# Patient Record
Sex: Female | Born: 1935
Health system: Southern US, Community
[De-identification: ages and names within clinical notes are randomized; demographics above are authoritative.]

## PROBLEM LIST (undated history)

## (undated) DIAGNOSIS — K5792 Diverticulitis of intestine, part unspecified, without perforation or abscess without bleeding: Secondary | ICD-10-CM

## (undated) DIAGNOSIS — M1990 Unspecified osteoarthritis, unspecified site: Secondary | ICD-10-CM

## (undated) HISTORY — PX: JOINT REPLACEMENT: SHX530

---

## 2016-11-22 DIAGNOSIS — L570 Actinic keratosis: Secondary | ICD-10-CM | POA: Diagnosis not present

## 2016-12-21 DIAGNOSIS — F419 Anxiety disorder, unspecified: Secondary | ICD-10-CM | POA: Diagnosis present

## 2016-12-21 DIAGNOSIS — I10 Essential (primary) hypertension: Secondary | ICD-10-CM | POA: Diagnosis not present

## 2016-12-21 DIAGNOSIS — G4700 Insomnia, unspecified: Secondary | ICD-10-CM | POA: Diagnosis present

## 2016-12-21 DIAGNOSIS — E039 Hypothyroidism, unspecified: Secondary | ICD-10-CM | POA: Diagnosis present

## 2016-12-21 DIAGNOSIS — K5730 Diverticulosis of large intestine without perforation or abscess without bleeding: Secondary | ICD-10-CM | POA: Diagnosis not present

## 2016-12-21 DIAGNOSIS — M19012 Primary osteoarthritis, left shoulder: Secondary | ICD-10-CM | POA: Diagnosis not present

## 2016-12-21 DIAGNOSIS — Z471 Aftercare following joint replacement surgery: Secondary | ICD-10-CM | POA: Diagnosis not present

## 2016-12-21 DIAGNOSIS — M8580 Other specified disorders of bone density and structure, unspecified site: Secondary | ICD-10-CM | POA: Diagnosis present

## 2016-12-21 DIAGNOSIS — I129 Hypertensive chronic kidney disease with stage 1 through stage 4 chronic kidney disease, or unspecified chronic kidney disease: Secondary | ICD-10-CM | POA: Diagnosis present

## 2016-12-21 DIAGNOSIS — S72002A Fracture of unspecified part of neck of left femur, initial encounter for closed fracture: Secondary | ICD-10-CM | POA: Diagnosis not present

## 2016-12-21 DIAGNOSIS — E890 Postprocedural hypothyroidism: Secondary | ICD-10-CM | POA: Diagnosis not present

## 2016-12-21 DIAGNOSIS — F418 Other specified anxiety disorders: Secondary | ICD-10-CM | POA: Diagnosis not present

## 2016-12-21 DIAGNOSIS — G8918 Other acute postprocedural pain: Secondary | ICD-10-CM | POA: Diagnosis not present

## 2016-12-21 DIAGNOSIS — F329 Major depressive disorder, single episode, unspecified: Secondary | ICD-10-CM | POA: Diagnosis not present

## 2016-12-21 DIAGNOSIS — I252 Old myocardial infarction: Secondary | ICD-10-CM | POA: Diagnosis not present

## 2016-12-21 DIAGNOSIS — E785 Hyperlipidemia, unspecified: Secondary | ICD-10-CM | POA: Diagnosis present

## 2016-12-21 DIAGNOSIS — S72002D Fracture of unspecified part of neck of left femur, subsequent encounter for closed fracture with routine healing: Secondary | ICD-10-CM | POA: Diagnosis not present

## 2016-12-21 DIAGNOSIS — I6529 Occlusion and stenosis of unspecified carotid artery: Secondary | ICD-10-CM | POA: Diagnosis not present

## 2016-12-21 DIAGNOSIS — E782 Mixed hyperlipidemia: Secondary | ICD-10-CM | POA: Diagnosis not present

## 2016-12-21 DIAGNOSIS — Z7982 Long term (current) use of aspirin: Secondary | ICD-10-CM | POA: Diagnosis not present

## 2016-12-21 DIAGNOSIS — R262 Difficulty in walking, not elsewhere classified: Secondary | ICD-10-CM | POA: Diagnosis not present

## 2016-12-21 DIAGNOSIS — H8149 Vertigo of central origin, unspecified ear: Secondary | ICD-10-CM | POA: Diagnosis not present

## 2016-12-21 DIAGNOSIS — I959 Hypotension, unspecified: Secondary | ICD-10-CM | POA: Diagnosis not present

## 2016-12-21 DIAGNOSIS — F4323 Adjustment disorder with mixed anxiety and depressed mood: Secondary | ICD-10-CM | POA: Diagnosis not present

## 2016-12-21 DIAGNOSIS — K219 Gastro-esophageal reflux disease without esophagitis: Secondary | ICD-10-CM | POA: Diagnosis present

## 2016-12-21 DIAGNOSIS — M6281 Muscle weakness (generalized): Secondary | ICD-10-CM | POA: Diagnosis not present

## 2016-12-21 DIAGNOSIS — Z01818 Encounter for other preprocedural examination: Secondary | ICD-10-CM | POA: Diagnosis not present

## 2016-12-21 DIAGNOSIS — N179 Acute kidney failure, unspecified: Secondary | ICD-10-CM | POA: Diagnosis not present

## 2016-12-21 DIAGNOSIS — Z96642 Presence of left artificial hip joint: Secondary | ICD-10-CM | POA: Diagnosis not present

## 2016-12-21 DIAGNOSIS — H3530 Unspecified macular degeneration: Secondary | ICD-10-CM | POA: Diagnosis not present

## 2016-12-21 DIAGNOSIS — N183 Chronic kidney disease, stage 3 (moderate): Secondary | ICD-10-CM | POA: Diagnosis present

## 2016-12-25 DIAGNOSIS — F418 Other specified anxiety disorders: Secondary | ICD-10-CM | POA: Diagnosis not present

## 2016-12-25 DIAGNOSIS — L03116 Cellulitis of left lower limb: Secondary | ICD-10-CM | POA: Diagnosis not present

## 2016-12-25 DIAGNOSIS — G4700 Insomnia, unspecified: Secondary | ICD-10-CM | POA: Diagnosis not present

## 2016-12-25 DIAGNOSIS — E890 Postprocedural hypothyroidism: Secondary | ICD-10-CM | POA: Diagnosis not present

## 2016-12-25 DIAGNOSIS — H3530 Unspecified macular degeneration: Secondary | ICD-10-CM | POA: Diagnosis not present

## 2016-12-25 DIAGNOSIS — M8580 Other specified disorders of bone density and structure, unspecified site: Secondary | ICD-10-CM | POA: Diagnosis not present

## 2016-12-25 DIAGNOSIS — S72002D Fracture of unspecified part of neck of left femur, subsequent encounter for closed fracture with routine healing: Secondary | ICD-10-CM | POA: Diagnosis not present

## 2016-12-25 DIAGNOSIS — R262 Difficulty in walking, not elsewhere classified: Secondary | ICD-10-CM | POA: Diagnosis not present

## 2016-12-25 DIAGNOSIS — M6281 Muscle weakness (generalized): Secondary | ICD-10-CM | POA: Diagnosis not present

## 2016-12-25 DIAGNOSIS — K219 Gastro-esophageal reflux disease without esophagitis: Secondary | ICD-10-CM | POA: Diagnosis not present

## 2016-12-25 DIAGNOSIS — I6529 Occlusion and stenosis of unspecified carotid artery: Secondary | ICD-10-CM | POA: Diagnosis not present

## 2016-12-25 DIAGNOSIS — K5730 Diverticulosis of large intestine without perforation or abscess without bleeding: Secondary | ICD-10-CM | POA: Diagnosis not present

## 2016-12-25 DIAGNOSIS — F4323 Adjustment disorder with mixed anxiety and depressed mood: Secondary | ICD-10-CM | POA: Diagnosis not present

## 2016-12-25 DIAGNOSIS — M25562 Pain in left knee: Secondary | ICD-10-CM | POA: Diagnosis not present

## 2016-12-25 DIAGNOSIS — S72002A Fracture of unspecified part of neck of left femur, initial encounter for closed fracture: Secondary | ICD-10-CM | POA: Diagnosis not present

## 2016-12-25 DIAGNOSIS — H8149 Vertigo of central origin, unspecified ear: Secondary | ICD-10-CM | POA: Diagnosis not present

## 2016-12-25 DIAGNOSIS — E785 Hyperlipidemia, unspecified: Secondary | ICD-10-CM | POA: Diagnosis not present

## 2016-12-25 DIAGNOSIS — R2242 Localized swelling, mass and lump, left lower limb: Secondary | ICD-10-CM | POA: Diagnosis not present

## 2016-12-25 DIAGNOSIS — I10 Essential (primary) hypertension: Secondary | ICD-10-CM | POA: Diagnosis not present

## 2016-12-25 DIAGNOSIS — M19012 Primary osteoarthritis, left shoulder: Secondary | ICD-10-CM | POA: Diagnosis not present

## 2016-12-25 DIAGNOSIS — Z96642 Presence of left artificial hip joint: Secondary | ICD-10-CM | POA: Diagnosis not present

## 2016-12-25 DIAGNOSIS — N183 Chronic kidney disease, stage 3 (moderate): Secondary | ICD-10-CM | POA: Diagnosis not present

## 2016-12-28 DIAGNOSIS — I10 Essential (primary) hypertension: Secondary | ICD-10-CM | POA: Diagnosis not present

## 2016-12-28 DIAGNOSIS — S72002D Fracture of unspecified part of neck of left femur, subsequent encounter for closed fracture with routine healing: Secondary | ICD-10-CM | POA: Diagnosis not present

## 2016-12-28 DIAGNOSIS — N183 Chronic kidney disease, stage 3 (moderate): Secondary | ICD-10-CM | POA: Diagnosis not present

## 2016-12-28 DIAGNOSIS — M6281 Muscle weakness (generalized): Secondary | ICD-10-CM | POA: Diagnosis not present

## 2017-01-06 DIAGNOSIS — L03116 Cellulitis of left lower limb: Secondary | ICD-10-CM | POA: Diagnosis not present

## 2017-01-06 DIAGNOSIS — M6281 Muscle weakness (generalized): Secondary | ICD-10-CM | POA: Diagnosis not present

## 2017-01-06 DIAGNOSIS — N183 Chronic kidney disease, stage 3 (moderate): Secondary | ICD-10-CM | POA: Diagnosis not present

## 2017-01-06 DIAGNOSIS — S72002D Fracture of unspecified part of neck of left femur, subsequent encounter for closed fracture with routine healing: Secondary | ICD-10-CM | POA: Diagnosis not present

## 2017-01-14 DIAGNOSIS — M6281 Muscle weakness (generalized): Secondary | ICD-10-CM | POA: Diagnosis not present

## 2017-01-14 DIAGNOSIS — L03116 Cellulitis of left lower limb: Secondary | ICD-10-CM | POA: Diagnosis not present

## 2017-01-14 DIAGNOSIS — N183 Chronic kidney disease, stage 3 (moderate): Secondary | ICD-10-CM | POA: Diagnosis not present

## 2017-01-14 DIAGNOSIS — S72002D Fracture of unspecified part of neck of left femur, subsequent encounter for closed fracture with routine healing: Secondary | ICD-10-CM | POA: Diagnosis not present

## 2017-01-16 DIAGNOSIS — S72002D Fracture of unspecified part of neck of left femur, subsequent encounter for closed fracture with routine healing: Secondary | ICD-10-CM | POA: Diagnosis not present

## 2017-01-16 DIAGNOSIS — M25562 Pain in left knee: Secondary | ICD-10-CM | POA: Diagnosis not present

## 2017-01-19 DIAGNOSIS — Z96642 Presence of left artificial hip joint: Secondary | ICD-10-CM | POA: Diagnosis not present

## 2017-01-19 DIAGNOSIS — S72002D Fracture of unspecified part of neck of left femur, subsequent encounter for closed fracture with routine healing: Secondary | ICD-10-CM | POA: Diagnosis not present

## 2017-01-19 DIAGNOSIS — R600 Localized edema: Secondary | ICD-10-CM | POA: Diagnosis not present

## 2017-01-23 DIAGNOSIS — R600 Localized edema: Secondary | ICD-10-CM | POA: Diagnosis not present

## 2017-01-23 DIAGNOSIS — Z96642 Presence of left artificial hip joint: Secondary | ICD-10-CM | POA: Diagnosis not present

## 2017-01-23 DIAGNOSIS — S72002D Fracture of unspecified part of neck of left femur, subsequent encounter for closed fracture with routine healing: Secondary | ICD-10-CM | POA: Diagnosis not present

## 2017-01-26 DIAGNOSIS — R600 Localized edema: Secondary | ICD-10-CM | POA: Diagnosis not present

## 2017-01-26 DIAGNOSIS — S72002D Fracture of unspecified part of neck of left femur, subsequent encounter for closed fracture with routine healing: Secondary | ICD-10-CM | POA: Diagnosis not present

## 2017-01-26 DIAGNOSIS — Z96642 Presence of left artificial hip joint: Secondary | ICD-10-CM | POA: Diagnosis not present

## 2017-01-30 DIAGNOSIS — R600 Localized edema: Secondary | ICD-10-CM | POA: Diagnosis not present

## 2017-01-30 DIAGNOSIS — Z96642 Presence of left artificial hip joint: Secondary | ICD-10-CM | POA: Diagnosis not present

## 2017-01-30 DIAGNOSIS — S72002D Fracture of unspecified part of neck of left femur, subsequent encounter for closed fracture with routine healing: Secondary | ICD-10-CM | POA: Diagnosis not present

## 2017-02-02 DIAGNOSIS — Z96642 Presence of left artificial hip joint: Secondary | ICD-10-CM | POA: Diagnosis not present

## 2017-02-02 DIAGNOSIS — R600 Localized edema: Secondary | ICD-10-CM | POA: Diagnosis not present

## 2017-02-02 DIAGNOSIS — S72002D Fracture of unspecified part of neck of left femur, subsequent encounter for closed fracture with routine healing: Secondary | ICD-10-CM | POA: Diagnosis not present

## 2017-02-06 DIAGNOSIS — S72002D Fracture of unspecified part of neck of left femur, subsequent encounter for closed fracture with routine healing: Secondary | ICD-10-CM | POA: Diagnosis not present

## 2017-02-06 DIAGNOSIS — Z96642 Presence of left artificial hip joint: Secondary | ICD-10-CM | POA: Diagnosis not present

## 2017-02-06 DIAGNOSIS — R600 Localized edema: Secondary | ICD-10-CM | POA: Diagnosis not present

## 2017-02-09 DIAGNOSIS — S72002D Fracture of unspecified part of neck of left femur, subsequent encounter for closed fracture with routine healing: Secondary | ICD-10-CM | POA: Diagnosis not present

## 2017-02-09 DIAGNOSIS — Z96642 Presence of left artificial hip joint: Secondary | ICD-10-CM | POA: Diagnosis not present

## 2017-02-09 DIAGNOSIS — R600 Localized edema: Secondary | ICD-10-CM | POA: Diagnosis not present

## 2017-02-13 DIAGNOSIS — R600 Localized edema: Secondary | ICD-10-CM | POA: Diagnosis not present

## 2017-02-13 DIAGNOSIS — Z96642 Presence of left artificial hip joint: Secondary | ICD-10-CM | POA: Diagnosis not present

## 2017-02-13 DIAGNOSIS — S72002D Fracture of unspecified part of neck of left femur, subsequent encounter for closed fracture with routine healing: Secondary | ICD-10-CM | POA: Diagnosis not present

## 2017-02-16 DIAGNOSIS — R600 Localized edema: Secondary | ICD-10-CM | POA: Diagnosis not present

## 2017-02-16 DIAGNOSIS — Z96642 Presence of left artificial hip joint: Secondary | ICD-10-CM | POA: Diagnosis not present

## 2017-02-16 DIAGNOSIS — S72002D Fracture of unspecified part of neck of left femur, subsequent encounter for closed fracture with routine healing: Secondary | ICD-10-CM | POA: Diagnosis not present

## 2017-02-23 DIAGNOSIS — S72002D Fracture of unspecified part of neck of left femur, subsequent encounter for closed fracture with routine healing: Secondary | ICD-10-CM | POA: Diagnosis not present

## 2017-02-23 DIAGNOSIS — Z96642 Presence of left artificial hip joint: Secondary | ICD-10-CM | POA: Diagnosis not present

## 2017-02-23 DIAGNOSIS — R600 Localized edema: Secondary | ICD-10-CM | POA: Diagnosis not present

## 2017-03-01 DIAGNOSIS — L570 Actinic keratosis: Secondary | ICD-10-CM | POA: Diagnosis not present

## 2017-04-16 DIAGNOSIS — E890 Postprocedural hypothyroidism: Secondary | ICD-10-CM | POA: Diagnosis not present

## 2017-04-16 DIAGNOSIS — I10 Essential (primary) hypertension: Secondary | ICD-10-CM | POA: Diagnosis not present

## 2017-04-16 DIAGNOSIS — Z96642 Presence of left artificial hip joint: Secondary | ICD-10-CM | POA: Diagnosis not present

## 2017-04-16 DIAGNOSIS — Z1329 Encounter for screening for other suspected endocrine disorder: Secondary | ICD-10-CM | POA: Diagnosis not present

## 2017-04-16 DIAGNOSIS — H3530 Unspecified macular degeneration: Secondary | ICD-10-CM | POA: Diagnosis not present

## 2017-04-16 DIAGNOSIS — Z85828 Personal history of other malignant neoplasm of skin: Secondary | ICD-10-CM | POA: Diagnosis not present

## 2017-04-16 DIAGNOSIS — R5382 Chronic fatigue, unspecified: Secondary | ICD-10-CM | POA: Diagnosis not present

## 2017-04-16 DIAGNOSIS — Z136 Encounter for screening for cardiovascular disorders: Secondary | ICD-10-CM | POA: Diagnosis not present

## 2017-04-16 DIAGNOSIS — Z139 Encounter for screening, unspecified: Secondary | ICD-10-CM | POA: Diagnosis not present

## 2017-04-16 DIAGNOSIS — N182 Chronic kidney disease, stage 2 (mild): Secondary | ICD-10-CM | POA: Diagnosis not present

## 2017-06-06 DIAGNOSIS — L570 Actinic keratosis: Secondary | ICD-10-CM | POA: Diagnosis not present

## 2017-07-27 DIAGNOSIS — K219 Gastro-esophageal reflux disease without esophagitis: Secondary | ICD-10-CM | POA: Diagnosis not present

## 2017-07-27 DIAGNOSIS — G4700 Insomnia, unspecified: Secondary | ICD-10-CM | POA: Diagnosis not present

## 2017-07-27 DIAGNOSIS — K5732 Diverticulitis of large intestine without perforation or abscess without bleeding: Secondary | ICD-10-CM | POA: Diagnosis not present

## 2017-07-27 DIAGNOSIS — M1990 Unspecified osteoarthritis, unspecified site: Secondary | ICD-10-CM | POA: Diagnosis not present

## 2017-07-27 DIAGNOSIS — N183 Chronic kidney disease, stage 3 (moderate): Secondary | ICD-10-CM | POA: Diagnosis not present

## 2017-07-27 DIAGNOSIS — I129 Hypertensive chronic kidney disease with stage 1 through stage 4 chronic kidney disease, or unspecified chronic kidney disease: Secondary | ICD-10-CM | POA: Diagnosis not present

## 2017-07-27 DIAGNOSIS — E039 Hypothyroidism, unspecified: Secondary | ICD-10-CM | POA: Diagnosis not present

## 2017-07-27 DIAGNOSIS — Z79899 Other long term (current) drug therapy: Secondary | ICD-10-CM | POA: Diagnosis not present

## 2017-07-27 DIAGNOSIS — F329 Major depressive disorder, single episode, unspecified: Secondary | ICD-10-CM | POA: Diagnosis not present

## 2017-07-27 DIAGNOSIS — F419 Anxiety disorder, unspecified: Secondary | ICD-10-CM | POA: Diagnosis not present

## 2017-07-27 DIAGNOSIS — Z7982 Long term (current) use of aspirin: Secondary | ICD-10-CM | POA: Diagnosis not present

## 2017-07-27 DIAGNOSIS — E785 Hyperlipidemia, unspecified: Secondary | ICD-10-CM | POA: Diagnosis not present

## 2017-08-02 DIAGNOSIS — K5792 Diverticulitis of intestine, part unspecified, without perforation or abscess without bleeding: Secondary | ICD-10-CM | POA: Diagnosis not present

## 2017-08-02 DIAGNOSIS — R202 Paresthesia of skin: Secondary | ICD-10-CM | POA: Diagnosis not present

## 2017-08-17 DIAGNOSIS — Z23 Encounter for immunization: Secondary | ICD-10-CM | POA: Diagnosis not present

## 2018-03-18 DIAGNOSIS — Z79899 Other long term (current) drug therapy: Secondary | ICD-10-CM | POA: Diagnosis not present

## 2018-03-18 DIAGNOSIS — E7800 Pure hypercholesterolemia, unspecified: Secondary | ICD-10-CM | POA: Diagnosis not present

## 2018-03-18 DIAGNOSIS — I10 Essential (primary) hypertension: Secondary | ICD-10-CM | POA: Diagnosis not present

## 2018-03-18 DIAGNOSIS — F419 Anxiety disorder, unspecified: Secondary | ICD-10-CM | POA: Diagnosis not present

## 2018-03-18 DIAGNOSIS — E039 Hypothyroidism, unspecified: Secondary | ICD-10-CM | POA: Diagnosis not present

## 2018-03-18 DIAGNOSIS — H3530 Unspecified macular degeneration: Secondary | ICD-10-CM | POA: Diagnosis not present

## 2018-03-18 DIAGNOSIS — J301 Allergic rhinitis due to pollen: Secondary | ICD-10-CM | POA: Diagnosis not present

## 2018-03-18 DIAGNOSIS — K219 Gastro-esophageal reflux disease without esophagitis: Secondary | ICD-10-CM | POA: Diagnosis not present

## 2018-07-23 DIAGNOSIS — N393 Stress incontinence (female) (male): Secondary | ICD-10-CM | POA: Diagnosis not present

## 2018-07-23 DIAGNOSIS — E039 Hypothyroidism, unspecified: Secondary | ICD-10-CM | POA: Diagnosis not present

## 2018-07-23 DIAGNOSIS — F419 Anxiety disorder, unspecified: Secondary | ICD-10-CM | POA: Diagnosis not present

## 2018-07-23 DIAGNOSIS — Z0001 Encounter for general adult medical examination with abnormal findings: Secondary | ICD-10-CM | POA: Diagnosis not present

## 2018-07-23 DIAGNOSIS — Z79899 Other long term (current) drug therapy: Secondary | ICD-10-CM | POA: Diagnosis not present

## 2018-07-23 DIAGNOSIS — R300 Dysuria: Secondary | ICD-10-CM | POA: Diagnosis not present

## 2018-07-23 DIAGNOSIS — H3530 Unspecified macular degeneration: Secondary | ICD-10-CM | POA: Diagnosis not present

## 2018-07-23 DIAGNOSIS — E7800 Pure hypercholesterolemia, unspecified: Secondary | ICD-10-CM | POA: Diagnosis not present

## 2018-07-23 DIAGNOSIS — K219 Gastro-esophageal reflux disease without esophagitis: Secondary | ICD-10-CM | POA: Diagnosis not present

## 2018-07-23 DIAGNOSIS — N949 Unspecified condition associated with female genital organs and menstrual cycle: Secondary | ICD-10-CM | POA: Diagnosis not present

## 2018-07-23 DIAGNOSIS — J301 Allergic rhinitis due to pollen: Secondary | ICD-10-CM | POA: Diagnosis not present

## 2018-07-23 DIAGNOSIS — I10 Essential (primary) hypertension: Secondary | ICD-10-CM | POA: Diagnosis not present

## 2018-07-29 DIAGNOSIS — N3946 Mixed incontinence: Secondary | ICD-10-CM | POA: Diagnosis not present

## 2018-07-29 DIAGNOSIS — N952 Postmenopausal atrophic vaginitis: Secondary | ICD-10-CM | POA: Diagnosis not present

## 2018-07-29 DIAGNOSIS — N949 Unspecified condition associated with female genital organs and menstrual cycle: Secondary | ICD-10-CM | POA: Diagnosis not present

## 2018-08-13 DIAGNOSIS — Z23 Encounter for immunization: Secondary | ICD-10-CM | POA: Diagnosis not present

## 2018-08-16 ENCOUNTER — Emergency Department (HOSPITAL_COMMUNITY)
Admission: EM | Admit: 2018-08-16 | Discharge: 2018-08-16 | Disposition: A | Discharge status: Home / Self Care | Payer: Medicare Other | Attending: Emergency Medicine | Admitting: Emergency Medicine

## 2018-08-16 ENCOUNTER — Encounter (HOSPITAL_COMMUNITY): Payer: Self-pay | Admitting: Emergency Medicine

## 2018-08-16 ENCOUNTER — Emergency Department (HOSPITAL_COMMUNITY): Payer: Medicare Other

## 2018-08-16 DIAGNOSIS — R1032 Left lower quadrant pain: Secondary | ICD-10-CM | POA: Diagnosis present

## 2018-08-16 DIAGNOSIS — K5792 Diverticulitis of intestine, part unspecified, without perforation or abscess without bleeding: Secondary | ICD-10-CM | POA: Diagnosis not present

## 2018-08-16 DIAGNOSIS — K5730 Diverticulosis of large intestine without perforation or abscess without bleeding: Secondary | ICD-10-CM | POA: Diagnosis not present

## 2018-08-16 HISTORY — DX: Diverticulitis of intestine, part unspecified, without perforation or abscess without bleeding: K57.92

## 2018-08-16 LAB — COMPREHENSIVE METABOLIC PANEL
ALT: 12 U/L (ref 0–44)
AST: 20 U/L (ref 15–41)
Albumin: 3.9 g/dL (ref 3.5–5.0)
Alkaline Phosphatase: 82 U/L (ref 38–126)
Anion gap: 10 (ref 5–15)
BUN: 15 mg/dL (ref 8–23)
CO2: 26 mmol/L (ref 22–32)
Calcium: 9.6 mg/dL (ref 8.9–10.3)
Chloride: 104 mmol/L (ref 98–111)
Creatinine, Ser: 1.14 mg/dL — ABNORMAL HIGH (ref 0.44–1.00)
GFR calc Af Amer: 49 mL/min — ABNORMAL LOW (ref >60)
GFR calc non Af Amer: 42 mL/min — ABNORMAL LOW (ref >60)
Glucose, Bld: 106 mg/dL — ABNORMAL HIGH (ref 70–99)
Potassium: 4.3 mmol/L (ref 3.5–5.1)
Sodium: 140 mmol/L (ref 135–145)
Total Bilirubin: 1.5 mg/dL — ABNORMAL HIGH (ref 0.3–1.2)
Total Protein: 7.1 g/dL (ref 6.5–8.1)

## 2018-08-16 LAB — CBC
HCT: 44.2 % (ref 36.0–46.0)
Hemoglobin: 14.1 g/dL (ref 12.0–15.0)
MCH: 29.7 pg (ref 26.0–34.0)
MCHC: 31.9 g/dL (ref 30.0–36.0)
MCV: 93.2 fL (ref 78.0–100.0)
Platelets: 211 10*3/uL (ref 150–400)
RBC: 4.74 MIL/uL (ref 3.87–5.11)
RDW: 13.5 % (ref 11.5–15.5)
WBC: 6.3 10*3/uL (ref 4.0–10.5)

## 2018-08-16 LAB — URINALYSIS, ROUTINE W REFLEX MICROSCOPIC
Bilirubin Urine: NEGATIVE
Glucose, UA: NEGATIVE mg/dL
Hgb urine dipstick: NEGATIVE
Ketones, ur: NEGATIVE mg/dL
Leukocytes, UA: NEGATIVE
Nitrite: NEGATIVE
Protein, ur: NEGATIVE mg/dL
Specific Gravity, Urine: 1.019 (ref 1.005–1.030)
pH: 6 (ref 5.0–8.0)

## 2018-08-16 LAB — LIPASE, BLOOD: Lipase: 33 U/L (ref 11–51)

## 2018-08-16 MED ORDER — IOHEXOL 300 MG/ML  SOLN
100.0000 mL | Freq: Once | INTRAMUSCULAR | Status: AC | PRN
  Administered 2018-08-16: 100 mL via INTRAVENOUS

## 2018-08-16 MED ORDER — MORPHINE SULFATE (PF) 2 MG/ML IV SOLN
2.0000 mg | Freq: Once | INTRAVENOUS | Status: AC
  Administered 2018-08-16: 2 mg via INTRAVENOUS
  Filled 2018-08-16: qty 1

## 2018-08-16 MED ORDER — ONDANSETRON HCL 4 MG PO TABS: 4.0000 mg | ORAL_TABLET | Freq: Four times a day (QID) | ORAL | 0 refills | Status: AC

## 2018-08-16 MED ORDER — CIPROFLOXACIN HCL 500 MG PO TABS
500.0000 mg | ORAL_TABLET | Freq: Once | ORAL | Status: AC
  Administered 2018-08-16: 500 mg via ORAL
  Filled 2018-08-16: qty 1

## 2018-08-16 MED ORDER — METRONIDAZOLE 500 MG PO TABS: 500.0000 mg | ORAL_TABLET | Freq: Two times a day (BID) | ORAL | 0 refills | Status: AC

## 2018-08-16 MED ORDER — TRAMADOL HCL 50 MG PO TABS: 50.0000 mg | ORAL_TABLET | Freq: Four times a day (QID) | ORAL | 0 refills | Status: DC | PRN

## 2018-08-16 MED ORDER — METRONIDAZOLE 500 MG PO TABS
500.0000 mg | ORAL_TABLET | Freq: Once | ORAL | Status: AC
  Administered 2018-08-16: 500 mg via ORAL
  Filled 2018-08-16: qty 1

## 2018-08-16 MED ORDER — SODIUM CHLORIDE 0.9 % IV SOLN
Freq: Once | INTRAVENOUS | Status: AC
  Administered 2018-08-16: 08:00:00 via INTRAVENOUS

## 2018-08-16 MED ORDER — CIPROFLOXACIN HCL 500 MG PO TABS: 500.0000 mg | ORAL_TABLET | Freq: Two times a day (BID) | ORAL | 0 refills | Status: AC

## 2018-08-16 NOTE — Discharge Instructions (Signed)
Please return for any problem.  Follow-up with your regular doctor as instructed. °

## 2018-08-16 NOTE — ED Notes (Signed)
Patient ambulatory to bathroom with steady gait at this time 

## 2018-08-16 NOTE — ED Notes (Signed)
Untestable urine sample received by lab, order discontinued.

## 2018-08-16 NOTE — ED Triage Notes (Signed)
Pt arrives insisting she has diverticulitis, hx of same. Reports L sided abd pain for a few days. Reports last BM yesterday, reports no diarrhea and vomiting. No fevers. No blood in stool. Pain stays in L abd, no radiation.

## 2018-08-16 NOTE — ED Provider Notes (Signed)
[facility] EMERGENCY DEPARTMENT Provider Note   CSN: [id number] Arrival date & time: 08/16/18  3329     History   Chief Complaint Chief Complaint  Patient presents with  . Abdominal Pain    HPI Laurie Odonnell is a 84 y.o. female.  84 year old female with prior medical history as documented below presents for evaluation of left lower quadrant abdominal pain.  Patient reports prior history of diverticulitis.  She reports that her last flare of the diverticulitis was approximately 1 year ago.  At that time she was given antibiotics and treated as an outpatient.  She denies prior abdominal surgeries.  She denies prior inpatient treatment for diverticulitis.  She reports increasing left lower quadrant abdominal pain over the last 24 hours.  She denies associated fever.  She denies associated nausea, vomiting, bowel movement change, blood in stool, or other complaint.  She has not take anything at home for her symptoms.  The history is provided by the patient and medical records.  Abdominal Pain   This is a new problem. The current episode started yesterday. The problem occurs rarely. The problem has not changed since onset.The pain is located in the LLQ. The pain is mild. Pertinent negatives include fever, melena, nausea, vomiting, dysuria and frequency. Nothing aggravates the symptoms. Nothing relieves the symptoms.    Past Medical History:  Diagnosis Date  . Diverticulitis     There are no active problems to display for this patient.   History reviewed. No pertinent surgical history.   OB History   None      Home Medications    Prior to Admission medications   Not on File    Family History No family history on file.  Social History Social History   Tobacco Use  . Smoking status: Never Smoker  Substance Use Topics  . Alcohol use: Not Currently  . Drug use: Not Currently     Allergies   Patient has no known allergies.   Review of  Systems Review of Systems  Constitutional: Negative for fever.  Gastrointestinal: Positive for abdominal pain. Negative for melena, nausea and vomiting.  Genitourinary: Negative for dysuria and frequency.  All other systems reviewed and are negative.    Physical Exam Updated Vital Signs BP (!) 183/66 (BP Location: Right Arm)   Pulse 69   Temp (!) 97.4 F (36.3 C) (Oral)   Resp 19   Ht 5\' 8"  (1.727 m)   Wt 86.2 kg   SpO2 97%   BMI 28.89 kg/m   Physical Exam  Constitutional: She is oriented to person, place, and time. She appears well-developed and well-nourished. No distress.  HENT:  Head: Normocephalic and atraumatic.  Mouth/Throat: Oropharynx is clear and moist.  Eyes: Pupils are equal, round, and reactive to light. Conjunctivae and EOM are normal.  Neck: Normal range of motion. Neck supple.  Cardiovascular: Normal rate, regular rhythm and normal heart sounds.  Pulmonary/Chest: Effort normal and breath sounds normal. No respiratory distress.  Abdominal: Soft. Normal appearance. She exhibits no distension. There is tenderness in the left lower quadrant.  Musculoskeletal: Normal range of motion. She exhibits no edema or deformity.  Neurological: She is alert and oriented to person, place, and time.  Skin: Skin is warm and dry.  Psychiatric: She has a normal mood and affect.  Nursing note and vitals reviewed.    ED Treatments / Results  Labs (all labs ordered are listed, but only abnormal results are displayed) Labs Reviewed  COMPREHENSIVE  METABOLIC PANEL - Abnormal; Notable for the following components:      Result Value   Glucose, Bld 106 (*)    Creatinine, Ser 1.14 (*)    Total Bilirubin 1.5 (*)    GFR calc non Af Amer 42 (*)    GFR calc Af Amer 49 (*)    All other components within normal limits  LIPASE, BLOOD  CBC  URINALYSIS, ROUTINE W REFLEX MICROSCOPIC    EKG None  Radiology Ct Abdomen Pelvis W Contrast  Result Date: 08/16/2018 CLINICAL DATA:  Left  quadrant pain EXAM: CT ABDOMEN AND PELVIS WITH CONTRAST TECHNIQUE: Multidetector CT imaging of the abdomen and pelvis was performed using the standard protocol following bolus administration of intravenous contrast. CONTRAST:  157mL OMNIPAQUE IOHEXOL 300 MG/ML  SOLN COMPARISON:  None. FINDINGS: Lower chest: No acute abnormality Hepatobiliary: Prior cholecystectomy. Diffuse fatty infiltration. No focal hepatic abnormality or biliary ductal dilatation. Pancreas: No focal abnormality or ductal dilatation. Spleen: No focal abnormality.  Normal size. Adrenals/Urinary Tract: Bilateral cortical thinning. No hydronephrosis. Benign appearing midpole right renal cyst. Stomach/Bowel: Descending colonic and sigmoid diverticulosis. Inflammatory stranding noted around the distal descending colon compatible with active diverticulitis. No complicating feature. Stomach and small bowel decompressed, unremarkable. Vascular/Lymphatic: Aortic atherosclerosis. No enlarged abdominal or pelvic lymph nodes. Reproductive: Uterus and adnexa unremarkable.  No mass. Other: No free fluid or free air. Musculoskeletal: Prior left hip replacement. Degenerative changes in the lumbar spine. No acute bony abnormality. IMPRESSION: Descending colonic and sigmoid diverticulosis. Inflammatory stranding around the distal descending colon compatible with active diverticulitis. Mild fatty infiltration of the liver. Aortic atherosclerosis. Electronically Signed   By: Rolm Baptise M.D.   On: 08/16/2018 11:26    Procedures Procedures (including critical care time)  Medications Ordered in ED Medications  0.9 %  sodium chloride infusion (has no administration in time range)  morphine 2 MG/ML injection 2 mg (has no administration in time range)     Initial Impression / Assessment and Plan / ED Course  I have reviewed the triage vital signs and the nursing notes.  Pertinent labs & imaging results that were available during my care of the patient  were reviewed by me and considered in my medical decision making (see chart for details).      MDM  Screen complete  Patient is presenting for evaluation of left lower quadrant abdominal pain.  Patient's exam is consistent with likely recurrent diverticulitis.  Screening labs are without evidence of other pathology.  CT imaging reveals diverticulitis.  Patient feels improved following her ED evaluation.  She declines further work-up or admission.  She understands need for close follow-up.  Strict return precautions given and understood.   Final Clinical Impressions(s) / ED Diagnoses   Final diagnoses:  Diverticulitis    ED Discharge Orders         Ordered    ondansetron (ZOFRAN) 4 MG tablet  Every 6 hours     08/16/18 1207    traMADol (ULTRAM) 50 MG tablet  Every 6 hours PRN     08/16/18 1207    ciprofloxacin (CIPRO) 500 MG tablet  2 times daily     08/16/18 1207    metroNIDAZOLE (FLAGYL) 500 MG tablet  2 times daily     08/16/18 1207           Valarie Merino, MD 08/16/18 1208

## 2018-08-21 DIAGNOSIS — K5792 Diverticulitis of intestine, part unspecified, without perforation or abscess without bleeding: Secondary | ICD-10-CM | POA: Diagnosis not present

## 2018-09-09 DIAGNOSIS — N898 Other specified noninflammatory disorders of vagina: Secondary | ICD-10-CM | POA: Diagnosis not present

## 2018-09-09 DIAGNOSIS — N762 Acute vulvitis: Secondary | ICD-10-CM | POA: Diagnosis not present

## 2018-09-09 DIAGNOSIS — N39498 Other specified urinary incontinence: Secondary | ICD-10-CM | POA: Diagnosis not present

## 2018-11-26 DIAGNOSIS — N3281 Overactive bladder: Secondary | ICD-10-CM | POA: Diagnosis not present

## 2018-11-26 DIAGNOSIS — B373 Candidiasis of vulva and vagina: Secondary | ICD-10-CM | POA: Diagnosis not present

## 2018-11-26 DIAGNOSIS — N952 Postmenopausal atrophic vaginitis: Secondary | ICD-10-CM | POA: Diagnosis not present

## 2019-01-07 DIAGNOSIS — M67411 Ganglion, right shoulder: Secondary | ICD-10-CM | POA: Diagnosis not present

## 2019-01-07 DIAGNOSIS — S46001A Unspecified injury of muscle(s) and tendon(s) of the rotator cuff of right shoulder, initial encounter: Secondary | ICD-10-CM | POA: Diagnosis not present

## 2019-01-07 DIAGNOSIS — N3281 Overactive bladder: Secondary | ICD-10-CM | POA: Diagnosis not present

## 2019-01-23 ENCOUNTER — Other Ambulatory Visit: Payer: Self-pay | Admitting: Family Medicine

## 2019-01-23 ENCOUNTER — Ambulatory Visit
Admission: RE | Admit: 2019-01-23 | Discharge: 2019-01-23 | Disposition: A | Discharge status: Home / Self Care | Payer: Medicare Other | Source: Ambulatory Visit | Attending: Family Medicine | Admitting: Family Medicine

## 2019-01-23 DIAGNOSIS — M25511 Pain in right shoulder: Secondary | ICD-10-CM

## 2019-01-23 DIAGNOSIS — M7989 Other specified soft tissue disorders: Secondary | ICD-10-CM | POA: Diagnosis not present

## 2019-01-23 DIAGNOSIS — L729 Follicular cyst of the skin and subcutaneous tissue, unspecified: Secondary | ICD-10-CM | POA: Diagnosis not present

## 2019-01-23 DIAGNOSIS — L570 Actinic keratosis: Secondary | ICD-10-CM | POA: Diagnosis not present

## 2019-01-28 ENCOUNTER — Other Ambulatory Visit: Payer: Self-pay | Admitting: Family Medicine

## 2019-01-28 DIAGNOSIS — L729 Follicular cyst of the skin and subcutaneous tissue, unspecified: Secondary | ICD-10-CM

## 2019-03-28 DIAGNOSIS — L821 Other seborrheic keratosis: Secondary | ICD-10-CM | POA: Diagnosis not present

## 2019-03-28 DIAGNOSIS — C44619 Basal cell carcinoma of skin of left upper limb, including shoulder: Secondary | ICD-10-CM | POA: Diagnosis not present

## 2019-03-28 DIAGNOSIS — L905 Scar conditions and fibrosis of skin: Secondary | ICD-10-CM | POA: Diagnosis not present

## 2019-03-28 DIAGNOSIS — L570 Actinic keratosis: Secondary | ICD-10-CM | POA: Diagnosis not present

## 2019-03-28 DIAGNOSIS — D485 Neoplasm of uncertain behavior of skin: Secondary | ICD-10-CM | POA: Diagnosis not present

## 2019-03-28 DIAGNOSIS — D229 Melanocytic nevi, unspecified: Secondary | ICD-10-CM | POA: Diagnosis not present

## 2019-04-18 DIAGNOSIS — C44619 Basal cell carcinoma of skin of left upper limb, including shoulder: Secondary | ICD-10-CM | POA: Diagnosis not present

## 2019-04-18 DIAGNOSIS — L905 Scar conditions and fibrosis of skin: Secondary | ICD-10-CM | POA: Diagnosis not present

## 2019-04-18 DIAGNOSIS — D229 Melanocytic nevi, unspecified: Secondary | ICD-10-CM | POA: Diagnosis not present

## 2019-05-09 DIAGNOSIS — Z85828 Personal history of other malignant neoplasm of skin: Secondary | ICD-10-CM | POA: Diagnosis not present

## 2019-05-09 DIAGNOSIS — D692 Other nonthrombocytopenic purpura: Secondary | ICD-10-CM | POA: Diagnosis not present

## 2019-07-28 DIAGNOSIS — H3530 Unspecified macular degeneration: Secondary | ICD-10-CM | POA: Diagnosis not present

## 2019-07-28 DIAGNOSIS — I10 Essential (primary) hypertension: Secondary | ICD-10-CM | POA: Diagnosis not present

## 2019-07-28 DIAGNOSIS — Z0001 Encounter for general adult medical examination with abnormal findings: Secondary | ICD-10-CM | POA: Diagnosis not present

## 2019-07-28 DIAGNOSIS — F419 Anxiety disorder, unspecified: Secondary | ICD-10-CM | POA: Diagnosis not present

## 2019-07-28 DIAGNOSIS — E7800 Pure hypercholesterolemia, unspecified: Secondary | ICD-10-CM | POA: Diagnosis not present

## 2019-07-28 DIAGNOSIS — Z79899 Other long term (current) drug therapy: Secondary | ICD-10-CM | POA: Diagnosis not present

## 2019-07-28 DIAGNOSIS — N3946 Mixed incontinence: Secondary | ICD-10-CM | POA: Diagnosis not present

## 2019-07-28 DIAGNOSIS — E039 Hypothyroidism, unspecified: Secondary | ICD-10-CM | POA: Diagnosis not present

## 2019-08-05 DIAGNOSIS — N3281 Overactive bladder: Secondary | ICD-10-CM | POA: Diagnosis not present

## 2019-08-05 DIAGNOSIS — N952 Postmenopausal atrophic vaginitis: Secondary | ICD-10-CM | POA: Diagnosis not present

## 2019-08-05 DIAGNOSIS — Z23 Encounter for immunization: Secondary | ICD-10-CM | POA: Diagnosis not present

## 2019-10-24 DIAGNOSIS — L570 Actinic keratosis: Secondary | ICD-10-CM | POA: Diagnosis not present

## 2019-10-24 DIAGNOSIS — L708 Other acne: Secondary | ICD-10-CM | POA: Diagnosis not present

## 2019-10-24 DIAGNOSIS — D229 Melanocytic nevi, unspecified: Secondary | ICD-10-CM | POA: Diagnosis not present

## 2020-04-07 DIAGNOSIS — E039 Hypothyroidism, unspecified: Secondary | ICD-10-CM | POA: Diagnosis not present

## 2020-04-07 DIAGNOSIS — R609 Edema, unspecified: Secondary | ICD-10-CM | POA: Diagnosis not present

## 2020-04-07 DIAGNOSIS — Z79899 Other long term (current) drug therapy: Secondary | ICD-10-CM | POA: Diagnosis not present

## 2020-04-07 DIAGNOSIS — I10 Essential (primary) hypertension: Secondary | ICD-10-CM | POA: Diagnosis not present

## 2020-04-22 DIAGNOSIS — Z79899 Other long term (current) drug therapy: Secondary | ICD-10-CM | POA: Diagnosis not present

## 2020-04-22 DIAGNOSIS — I10 Essential (primary) hypertension: Secondary | ICD-10-CM | POA: Diagnosis not present

## 2020-04-22 DIAGNOSIS — M25473 Effusion, unspecified ankle: Secondary | ICD-10-CM | POA: Diagnosis not present

## 2020-04-26 DIAGNOSIS — D485 Neoplasm of uncertain behavior of skin: Secondary | ICD-10-CM | POA: Diagnosis not present

## 2020-04-26 DIAGNOSIS — L821 Other seborrheic keratosis: Secondary | ICD-10-CM | POA: Diagnosis not present

## 2020-04-26 DIAGNOSIS — Z85828 Personal history of other malignant neoplasm of skin: Secondary | ICD-10-CM | POA: Diagnosis not present

## 2020-04-26 DIAGNOSIS — L820 Inflamed seborrheic keratosis: Secondary | ICD-10-CM | POA: Diagnosis not present

## 2020-04-26 DIAGNOSIS — L905 Scar conditions and fibrosis of skin: Secondary | ICD-10-CM | POA: Diagnosis not present

## 2020-04-26 DIAGNOSIS — D229 Melanocytic nevi, unspecified: Secondary | ICD-10-CM | POA: Diagnosis not present

## 2020-05-19 DIAGNOSIS — R002 Palpitations: Secondary | ICD-10-CM | POA: Diagnosis not present

## 2020-05-19 DIAGNOSIS — I10 Essential (primary) hypertension: Secondary | ICD-10-CM | POA: Diagnosis not present

## 2020-05-19 DIAGNOSIS — R079 Chest pain, unspecified: Secondary | ICD-10-CM | POA: Diagnosis not present

## 2020-06-02 DIAGNOSIS — R002 Palpitations: Secondary | ICD-10-CM | POA: Diagnosis not present

## 2020-06-21 DIAGNOSIS — S0992XA Unspecified injury of nose, initial encounter: Secondary | ICD-10-CM | POA: Diagnosis not present

## 2020-06-21 DIAGNOSIS — R0781 Pleurodynia: Secondary | ICD-10-CM | POA: Diagnosis not present

## 2020-06-21 DIAGNOSIS — M545 Low back pain: Secondary | ICD-10-CM | POA: Diagnosis not present

## 2020-06-21 DIAGNOSIS — W19XXXA Unspecified fall, initial encounter: Secondary | ICD-10-CM | POA: Diagnosis not present

## 2020-06-22 ENCOUNTER — Ambulatory Visit
Admission: RE | Admit: 2020-06-22 | Discharge: 2020-06-22 | Disposition: A | Discharge status: Home / Self Care | Payer: Medicare Other | Source: Ambulatory Visit | Attending: Family Medicine | Admitting: Family Medicine

## 2020-06-22 ENCOUNTER — Other Ambulatory Visit: Payer: Self-pay | Admitting: Family Medicine

## 2020-06-22 DIAGNOSIS — R0781 Pleurodynia: Secondary | ICD-10-CM

## 2020-06-22 DIAGNOSIS — M545 Low back pain, unspecified: Secondary | ICD-10-CM

## 2020-06-22 DIAGNOSIS — J342 Deviated nasal septum: Secondary | ICD-10-CM | POA: Diagnosis not present

## 2020-06-22 DIAGNOSIS — I700 Atherosclerosis of aorta: Secondary | ICD-10-CM | POA: Diagnosis not present

## 2020-06-22 DIAGNOSIS — S0992XA Unspecified injury of nose, initial encounter: Secondary | ICD-10-CM | POA: Diagnosis not present

## 2020-06-22 DIAGNOSIS — M47816 Spondylosis without myelopathy or radiculopathy, lumbar region: Secondary | ICD-10-CM | POA: Diagnosis not present

## 2020-06-22 DIAGNOSIS — I517 Cardiomegaly: Secondary | ICD-10-CM | POA: Diagnosis not present

## 2020-06-22 DIAGNOSIS — M4186 Other forms of scoliosis, lumbar region: Secondary | ICD-10-CM | POA: Diagnosis not present

## 2020-06-22 DIAGNOSIS — S299XXA Unspecified injury of thorax, initial encounter: Secondary | ICD-10-CM | POA: Diagnosis not present

## 2020-08-03 DIAGNOSIS — Z23 Encounter for immunization: Secondary | ICD-10-CM | POA: Diagnosis not present

## 2020-08-30 DIAGNOSIS — E039 Hypothyroidism, unspecified: Secondary | ICD-10-CM | POA: Diagnosis not present

## 2020-08-30 DIAGNOSIS — Z0001 Encounter for general adult medical examination with abnormal findings: Secondary | ICD-10-CM | POA: Diagnosis not present

## 2020-08-30 DIAGNOSIS — Z79899 Other long term (current) drug therapy: Secondary | ICD-10-CM | POA: Diagnosis not present

## 2020-08-30 DIAGNOSIS — M25511 Pain in right shoulder: Secondary | ICD-10-CM | POA: Diagnosis not present

## 2020-08-30 DIAGNOSIS — I10 Essential (primary) hypertension: Secondary | ICD-10-CM | POA: Diagnosis not present

## 2020-08-30 DIAGNOSIS — H3530 Unspecified macular degeneration: Secondary | ICD-10-CM | POA: Diagnosis not present

## 2020-08-30 DIAGNOSIS — E7800 Pure hypercholesterolemia, unspecified: Secondary | ICD-10-CM | POA: Diagnosis not present

## 2020-08-30 DIAGNOSIS — M25411 Effusion, right shoulder: Secondary | ICD-10-CM | POA: Diagnosis not present

## 2020-08-31 DIAGNOSIS — Z23 Encounter for immunization: Secondary | ICD-10-CM | POA: Diagnosis not present

## 2020-09-01 DIAGNOSIS — G5601 Carpal tunnel syndrome, right upper limb: Secondary | ICD-10-CM | POA: Diagnosis not present

## 2020-09-01 DIAGNOSIS — M19211 Secondary osteoarthritis, right shoulder: Secondary | ICD-10-CM | POA: Diagnosis not present

## 2020-09-29 DIAGNOSIS — M19211 Secondary osteoarthritis, right shoulder: Secondary | ICD-10-CM | POA: Diagnosis not present

## 2020-09-29 DIAGNOSIS — G5601 Carpal tunnel syndrome, right upper limb: Secondary | ICD-10-CM | POA: Diagnosis not present

## 2021-03-07 DIAGNOSIS — I10 Essential (primary) hypertension: Secondary | ICD-10-CM | POA: Diagnosis not present

## 2021-03-07 DIAGNOSIS — E7800 Pure hypercholesterolemia, unspecified: Secondary | ICD-10-CM | POA: Diagnosis not present

## 2021-03-07 DIAGNOSIS — R0602 Shortness of breath: Secondary | ICD-10-CM | POA: Diagnosis not present

## 2021-03-07 DIAGNOSIS — E039 Hypothyroidism, unspecified: Secondary | ICD-10-CM | POA: Diagnosis not present

## 2021-03-07 DIAGNOSIS — R600 Localized edema: Secondary | ICD-10-CM | POA: Diagnosis not present

## 2021-03-07 DIAGNOSIS — Z79899 Other long term (current) drug therapy: Secondary | ICD-10-CM | POA: Diagnosis not present

## 2021-03-09 ENCOUNTER — Other Ambulatory Visit (HOSPITAL_COMMUNITY): Payer: Self-pay | Admitting: Family Medicine

## 2021-03-09 DIAGNOSIS — R0602 Shortness of breath: Secondary | ICD-10-CM

## 2021-03-21 DIAGNOSIS — Z79899 Other long term (current) drug therapy: Secondary | ICD-10-CM | POA: Diagnosis not present

## 2021-04-07 ENCOUNTER — Ambulatory Visit (HOSPITAL_COMMUNITY): Payer: Medicare Other | Attending: Cardiology

## 2021-04-07 ENCOUNTER — Other Ambulatory Visit: Payer: Self-pay

## 2021-04-07 ENCOUNTER — Encounter (INDEPENDENT_AMBULATORY_CARE_PROVIDER_SITE_OTHER): Payer: Self-pay

## 2021-04-07 DIAGNOSIS — R0602 Shortness of breath: Secondary | ICD-10-CM | POA: Insufficient documentation

## 2021-04-07 LAB — ECHOCARDIOGRAM COMPLETE
Area-P 1/2: 3.65 cm2
S' Lateral: 3 cm

## 2021-07-03 IMAGING — CR DG NASAL BONES 3+V
3 series · 3 of 3 positions shown · non-contrast
Comparison: None.

CLINICAL DATA: Nasal trauma after fall

EXAM:
NASAL BONES - 3+ VIEW

[w waters]
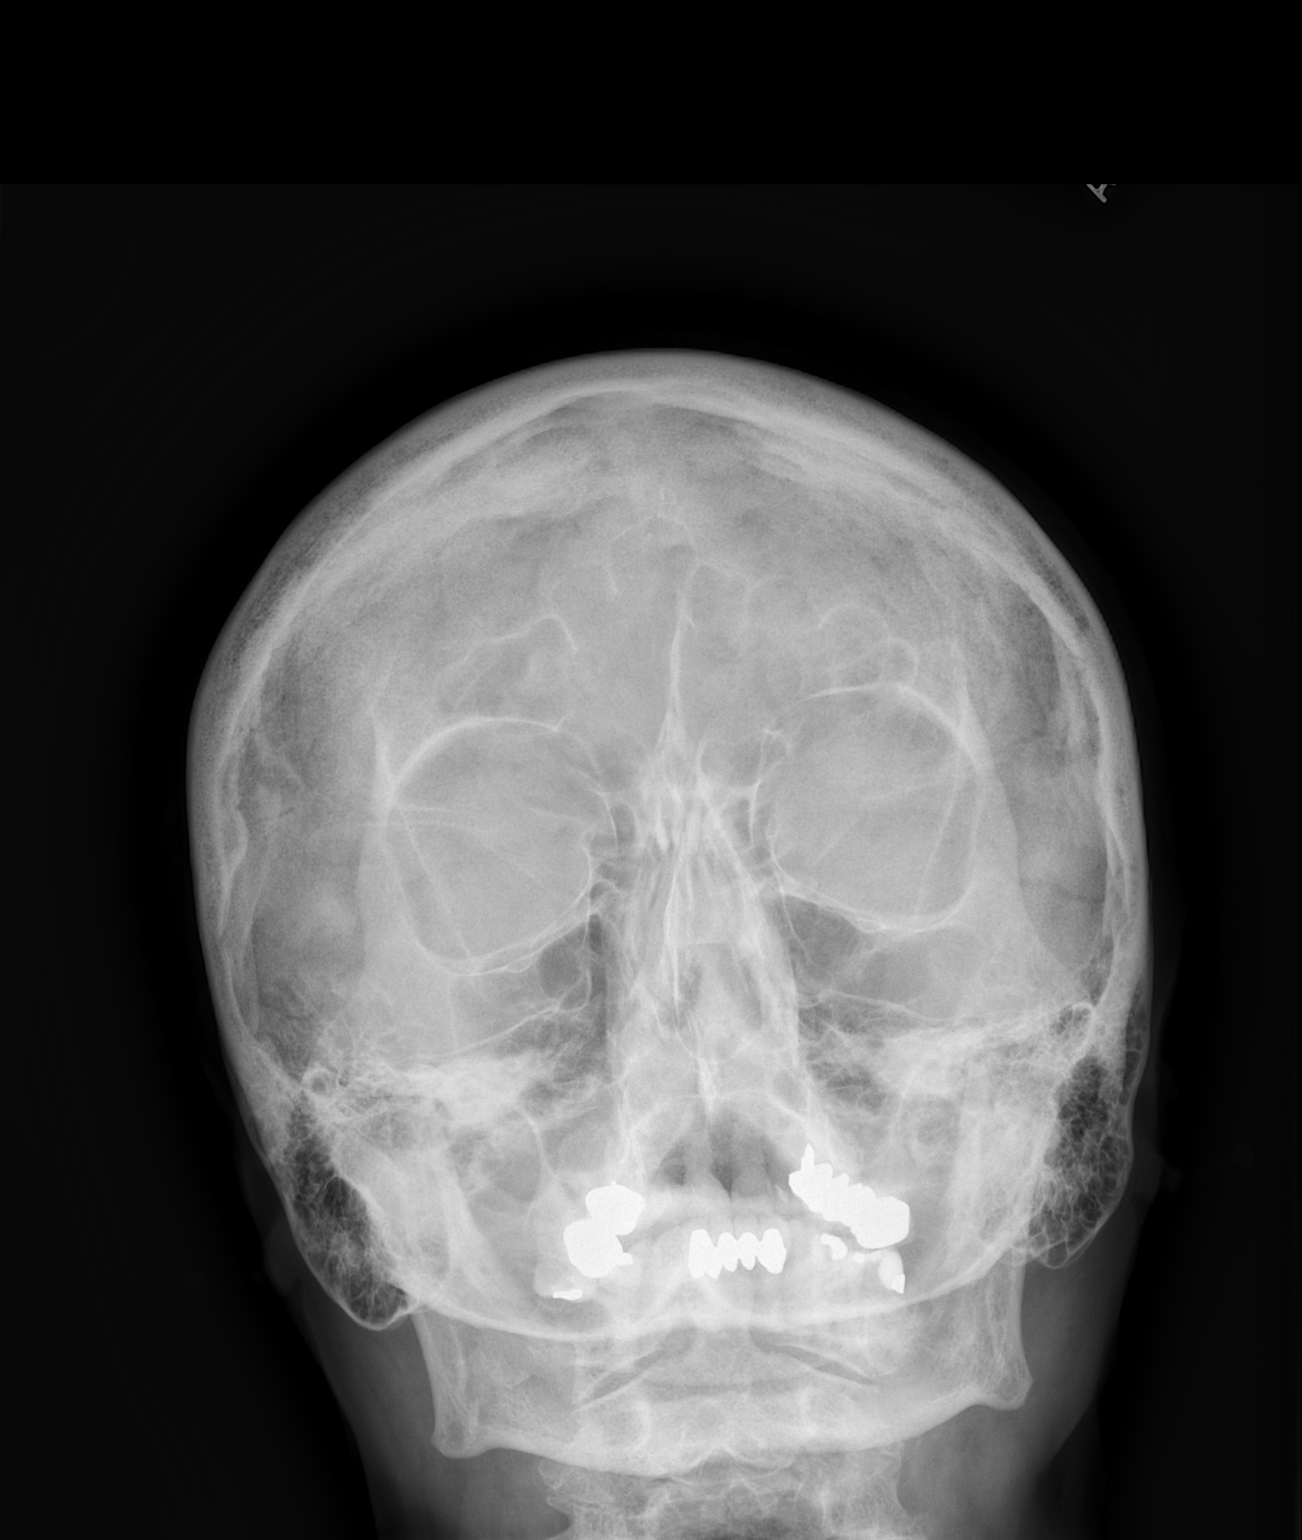

[w nasal bone lat * (1 of 2)]
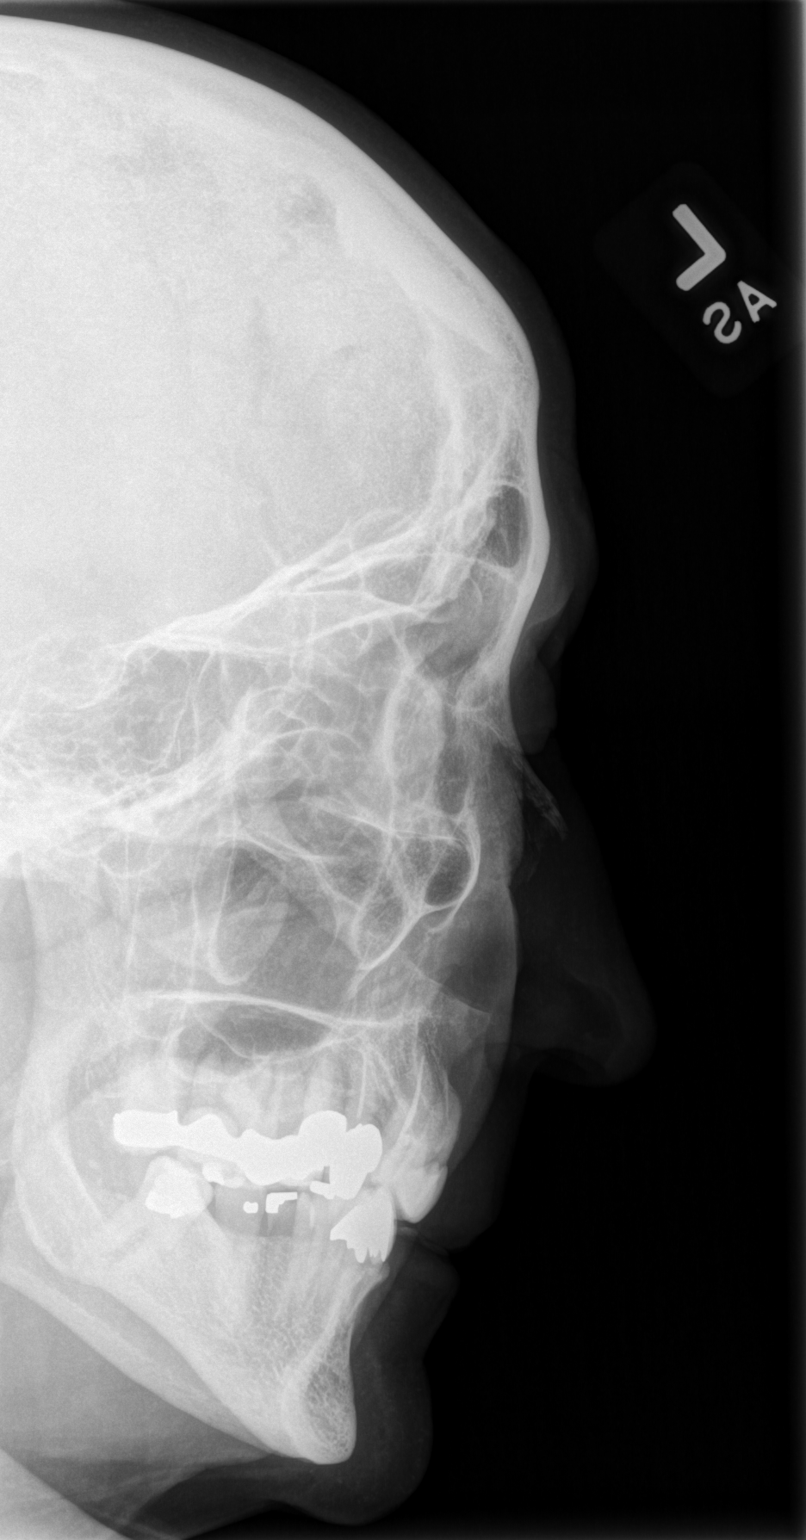

[w nasal bone lat * (2 of 2)]
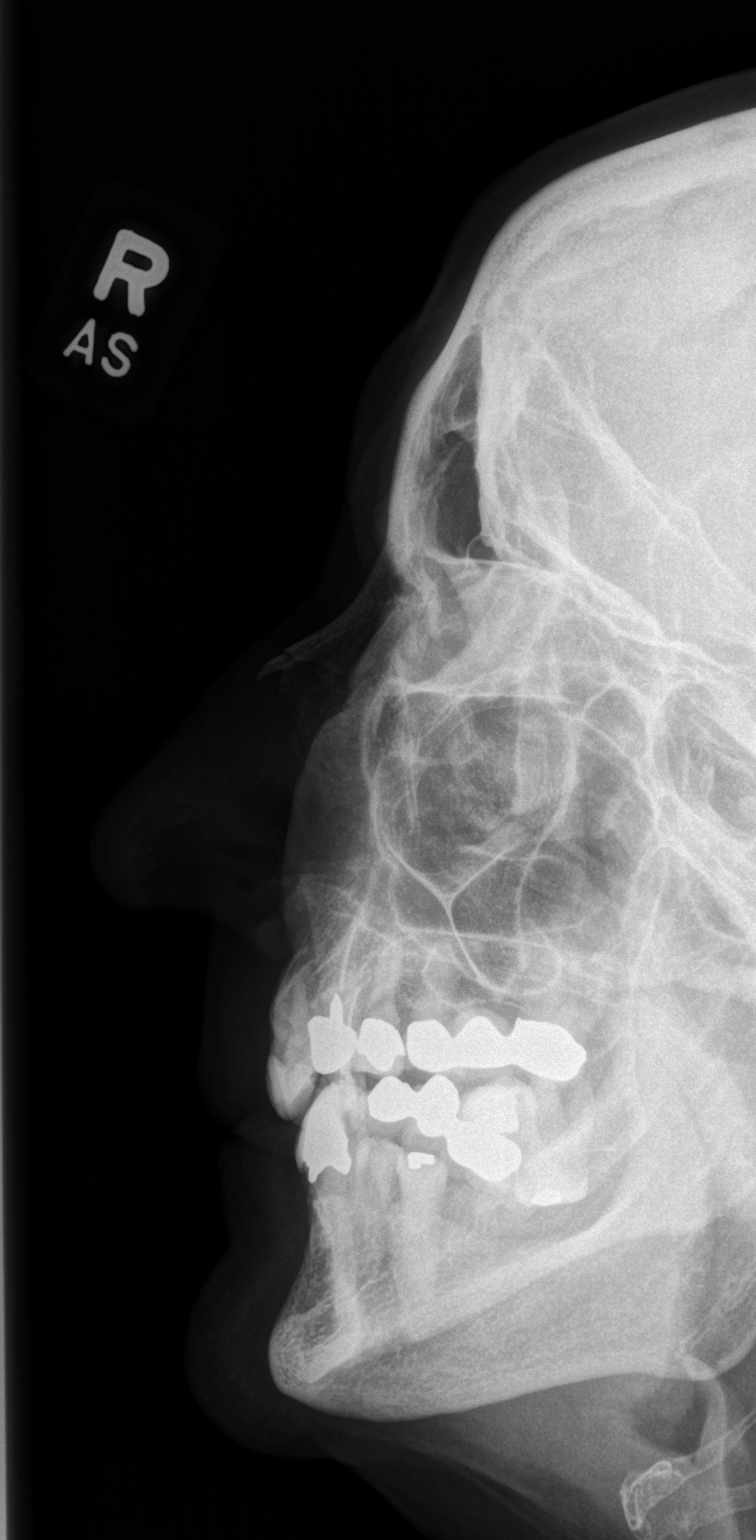

[3 of 3 positions shown; findings below may reference images not displayed]

FINDINGS: Suspect non depressed bilateral nasal bone fractures. Slight
rightward deviation of the bony nasal septum, age indeterminate.
Paranasal sinuses appear well aerated without air-fluid level.
IMPRESSION: Suspect non-depressed bilateral nasal bone fractures.

## 2021-07-03 IMAGING — CR DG RIBS W/ CHEST 3+V*R*
4 series · 4 of 4 positions shown · non-contrast
Comparison: None.

CLINICAL DATA: Right chest pain since a fall 06/19/2019. Initial
encounter.

EXAM:
RIGHT RIBS AND CHEST - 3+ VIEW

[w chest pa *]
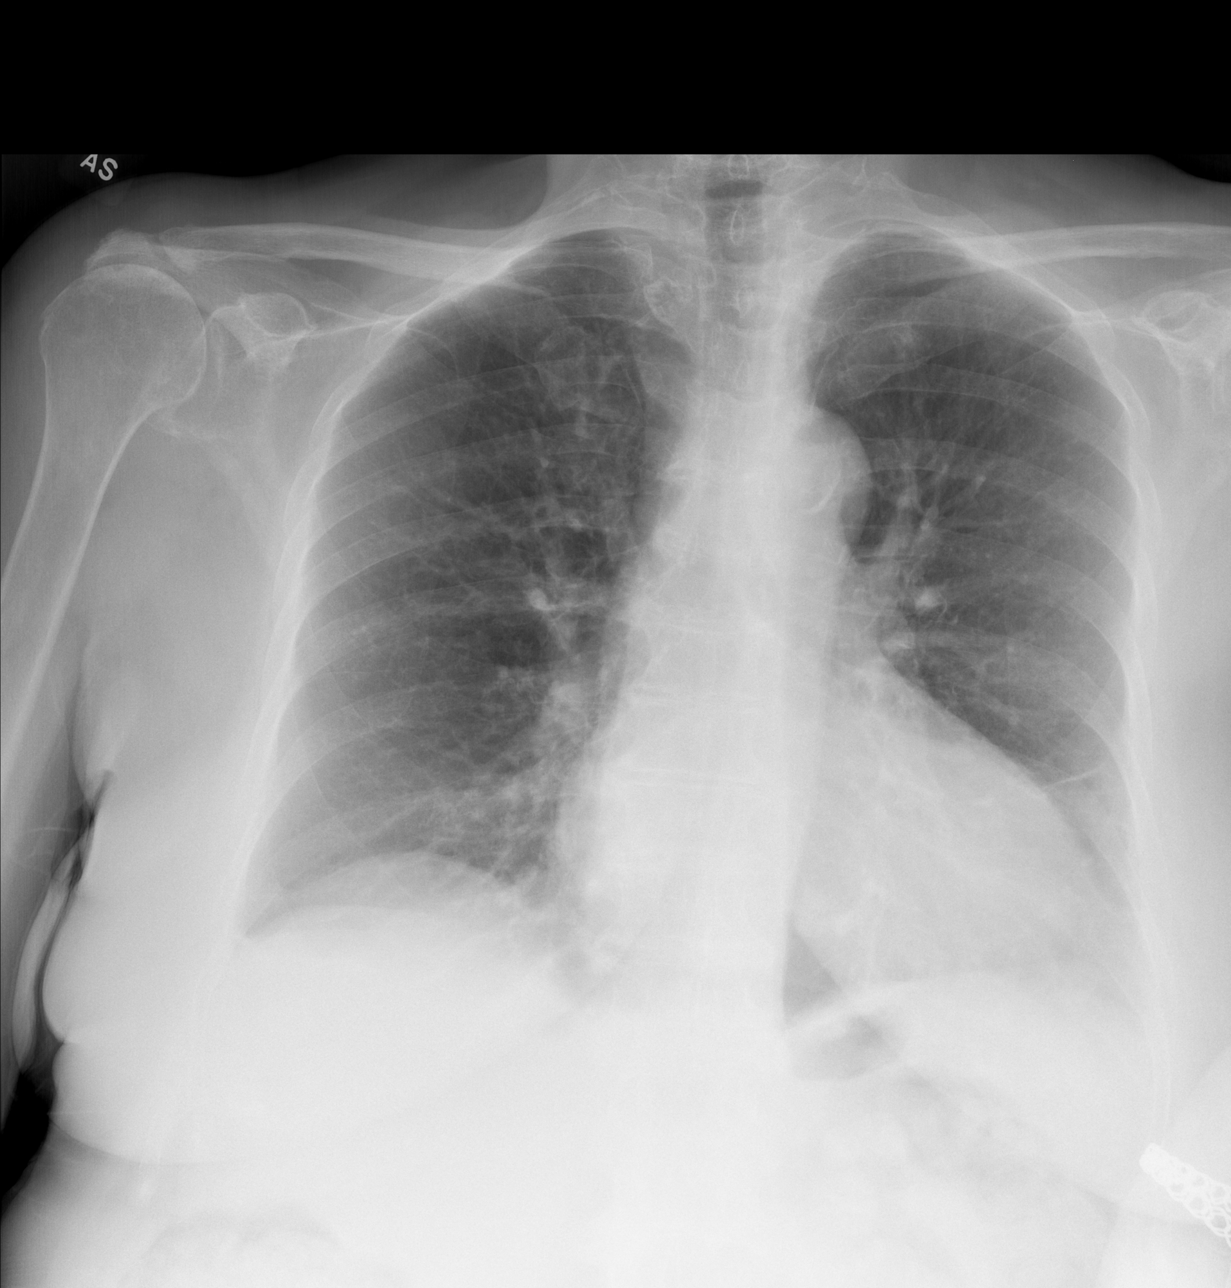

[w ribs ap/pa lower right *]
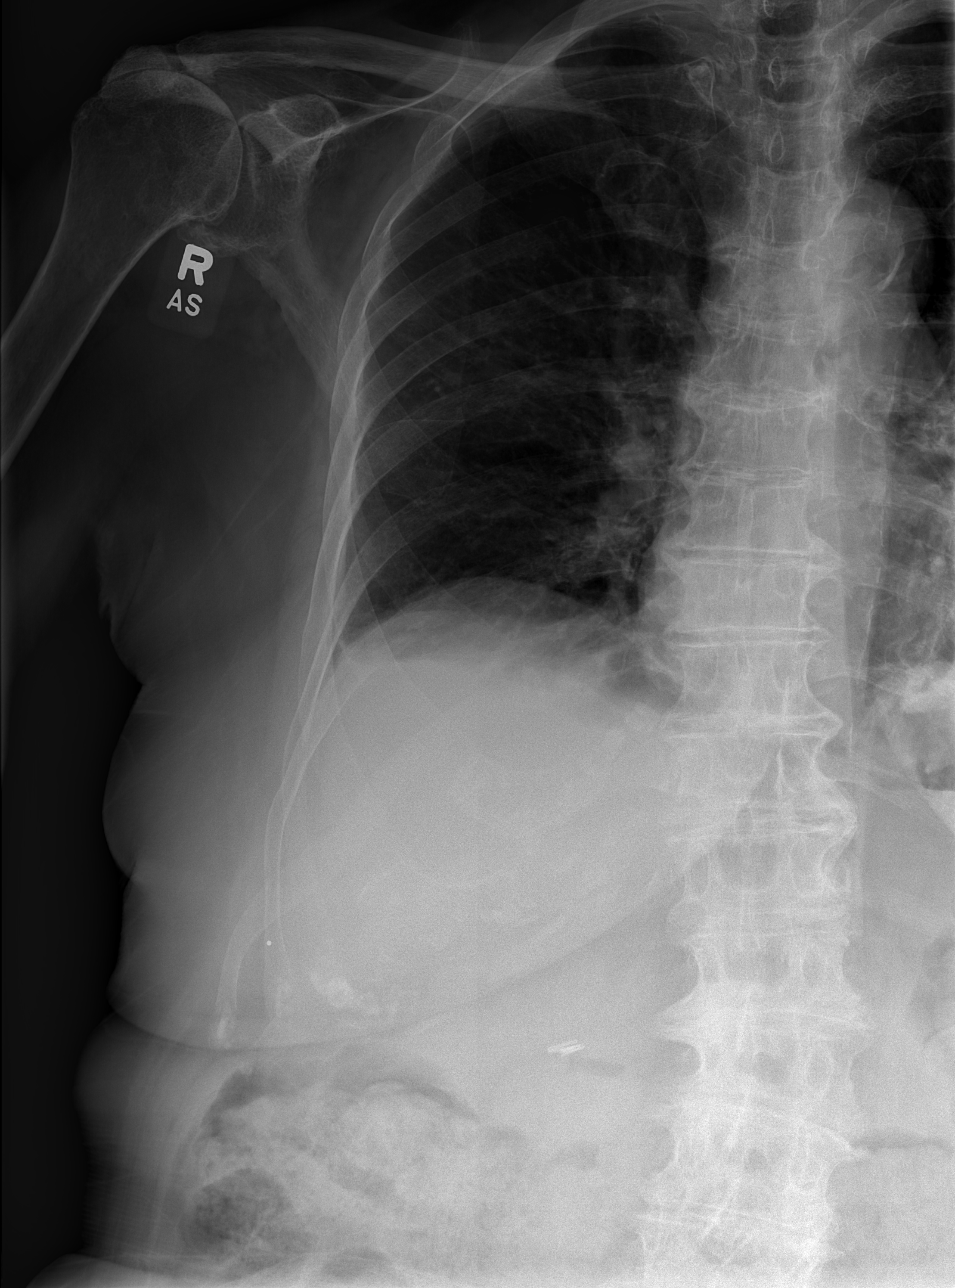

[w ribs oblique right * (1 of 2)]
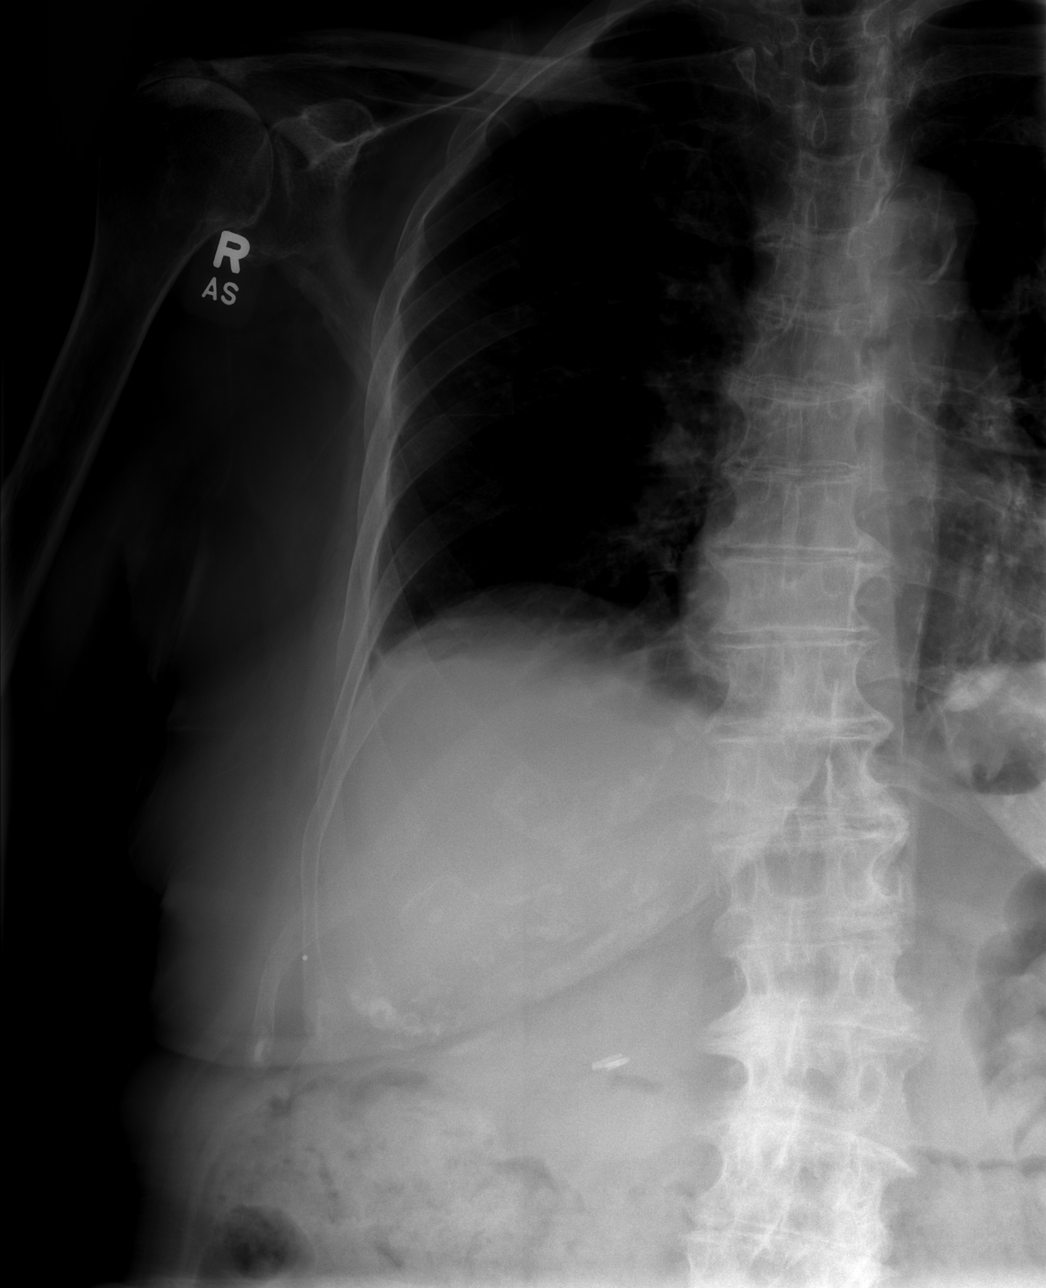

[w ribs oblique right * (2 of 2)]
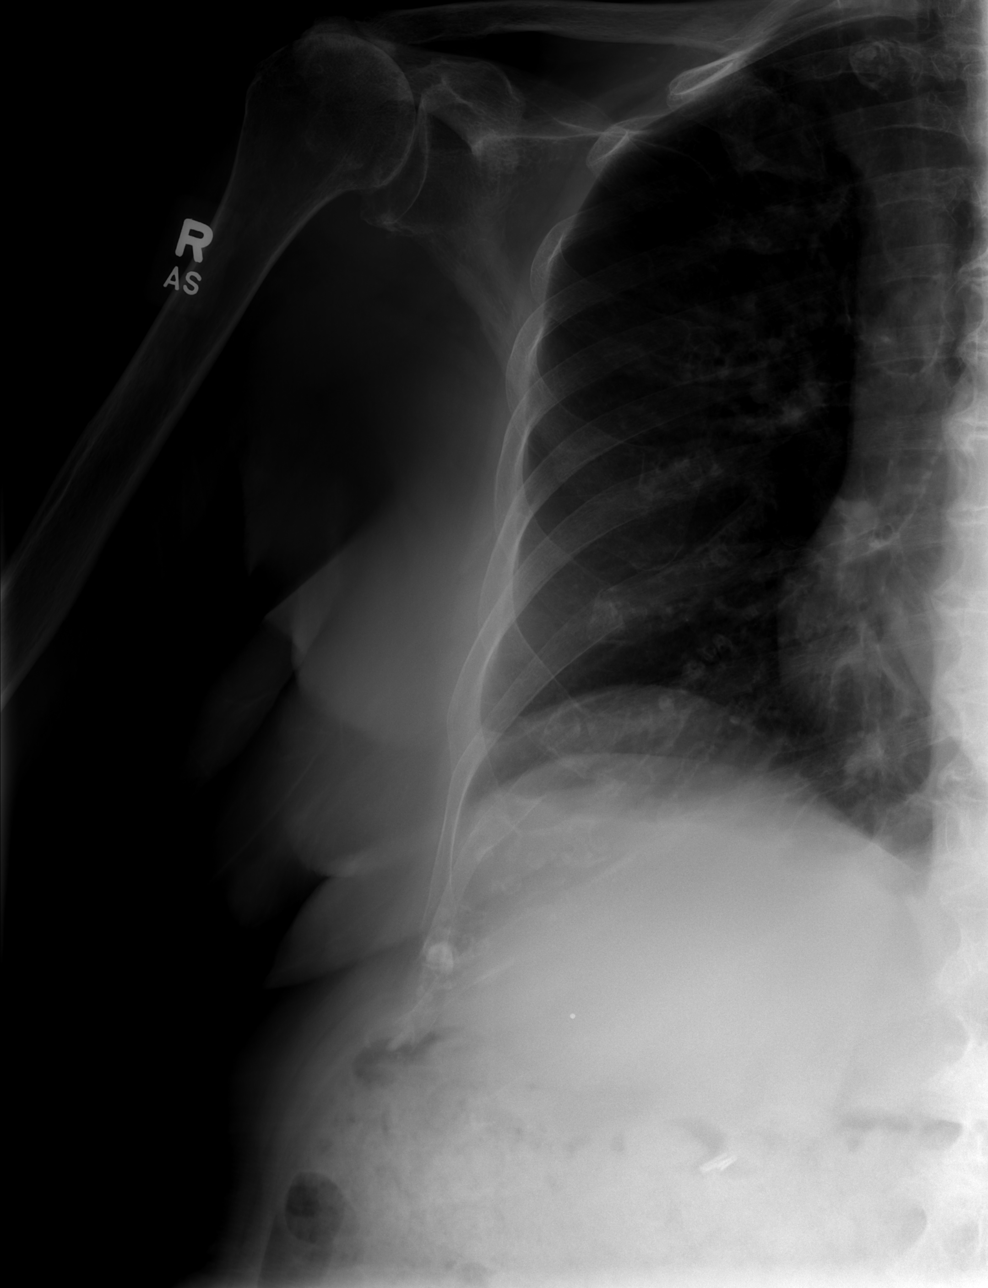

[4 of 4 positions shown; findings below may reference images not displayed]

FINDINGS: Single-view of the chest demonstrates clear lungs. Heart size is
enlarged. Aortic atherosclerosis. No pneumothorax or pleural fluid.
No rib fracture or other focal bony abnormality is seen.
IMPRESSION: Negative for fracture.  No acute abnormality.

Aortic Atherosclerosis ([id number]-I97.7).

Cardiomegaly.

## 2021-07-14 DIAGNOSIS — M1712 Unilateral primary osteoarthritis, left knee: Secondary | ICD-10-CM | POA: Diagnosis not present

## 2021-07-14 DIAGNOSIS — M1711 Unilateral primary osteoarthritis, right knee: Secondary | ICD-10-CM | POA: Diagnosis not present

## 2021-08-17 DIAGNOSIS — Z23 Encounter for immunization: Secondary | ICD-10-CM | POA: Diagnosis not present

## 2021-09-27 DIAGNOSIS — N3281 Overactive bladder: Secondary | ICD-10-CM | POA: Diagnosis not present

## 2021-09-27 DIAGNOSIS — E039 Hypothyroidism, unspecified: Secondary | ICD-10-CM | POA: Diagnosis not present

## 2021-09-27 DIAGNOSIS — E7800 Pure hypercholesterolemia, unspecified: Secondary | ICD-10-CM | POA: Diagnosis not present

## 2021-09-27 DIAGNOSIS — F419 Anxiety disorder, unspecified: Secondary | ICD-10-CM | POA: Diagnosis not present

## 2021-09-27 DIAGNOSIS — H3530 Unspecified macular degeneration: Secondary | ICD-10-CM | POA: Diagnosis not present

## 2021-09-27 DIAGNOSIS — Z79899 Other long term (current) drug therapy: Secondary | ICD-10-CM | POA: Diagnosis not present

## 2021-09-27 DIAGNOSIS — I700 Atherosclerosis of aorta: Secondary | ICD-10-CM | POA: Diagnosis not present

## 2021-09-27 DIAGNOSIS — Z0001 Encounter for general adult medical examination with abnormal findings: Secondary | ICD-10-CM | POA: Diagnosis not present

## 2021-09-27 DIAGNOSIS — I10 Essential (primary) hypertension: Secondary | ICD-10-CM | POA: Diagnosis not present

## 2021-10-11 DIAGNOSIS — Z79899 Other long term (current) drug therapy: Secondary | ICD-10-CM | POA: Diagnosis not present

## 2021-10-31 DIAGNOSIS — G5601 Carpal tunnel syndrome, right upper limb: Secondary | ICD-10-CM | POA: Diagnosis not present

## 2021-10-31 DIAGNOSIS — G5602 Carpal tunnel syndrome, left upper limb: Secondary | ICD-10-CM | POA: Diagnosis not present

## 2022-01-17 DIAGNOSIS — E7800 Pure hypercholesterolemia, unspecified: Secondary | ICD-10-CM | POA: Diagnosis not present

## 2022-01-17 DIAGNOSIS — E039 Hypothyroidism, unspecified: Secondary | ICD-10-CM | POA: Diagnosis not present

## 2022-01-17 DIAGNOSIS — I10 Essential (primary) hypertension: Secondary | ICD-10-CM | POA: Diagnosis not present

## 2022-01-18 DIAGNOSIS — M1712 Unilateral primary osteoarthritis, left knee: Secondary | ICD-10-CM | POA: Diagnosis not present

## 2022-01-18 DIAGNOSIS — M25562 Pain in left knee: Secondary | ICD-10-CM | POA: Diagnosis not present

## 2022-01-26 DIAGNOSIS — M25562 Pain in left knee: Secondary | ICD-10-CM | POA: Diagnosis not present

## 2022-01-26 DIAGNOSIS — M1712 Unilateral primary osteoarthritis, left knee: Secondary | ICD-10-CM | POA: Diagnosis not present

## 2022-01-31 DIAGNOSIS — E038 Other specified hypothyroidism: Secondary | ICD-10-CM | POA: Diagnosis not present

## 2022-01-31 DIAGNOSIS — E039 Hypothyroidism, unspecified: Secondary | ICD-10-CM | POA: Diagnosis not present

## 2022-02-02 DIAGNOSIS — M1712 Unilateral primary osteoarthritis, left knee: Secondary | ICD-10-CM | POA: Diagnosis not present

## 2022-02-02 DIAGNOSIS — M25562 Pain in left knee: Secondary | ICD-10-CM | POA: Diagnosis not present

## 2022-02-09 DIAGNOSIS — M1712 Unilateral primary osteoarthritis, left knee: Secondary | ICD-10-CM | POA: Diagnosis not present

## 2022-02-09 DIAGNOSIS — M25562 Pain in left knee: Secondary | ICD-10-CM | POA: Diagnosis not present

## 2022-02-15 DIAGNOSIS — M1712 Unilateral primary osteoarthritis, left knee: Secondary | ICD-10-CM | POA: Diagnosis not present

## 2022-02-15 DIAGNOSIS — M25562 Pain in left knee: Secondary | ICD-10-CM | POA: Diagnosis not present

## 2022-03-03 DIAGNOSIS — G5602 Carpal tunnel syndrome, left upper limb: Secondary | ICD-10-CM | POA: Diagnosis not present

## 2022-03-03 DIAGNOSIS — G5601 Carpal tunnel syndrome, right upper limb: Secondary | ICD-10-CM | POA: Diagnosis not present

## 2022-07-26 DIAGNOSIS — Z23 Encounter for immunization: Secondary | ICD-10-CM | POA: Diagnosis not present

## 2022-08-07 DIAGNOSIS — H3530 Unspecified macular degeneration: Secondary | ICD-10-CM | POA: Diagnosis not present

## 2022-08-07 DIAGNOSIS — Z0001 Encounter for general adult medical examination with abnormal findings: Secondary | ICD-10-CM | POA: Diagnosis not present

## 2022-08-07 DIAGNOSIS — Z79899 Other long term (current) drug therapy: Secondary | ICD-10-CM | POA: Diagnosis not present

## 2022-08-07 DIAGNOSIS — N3946 Mixed incontinence: Secondary | ICD-10-CM | POA: Diagnosis not present

## 2022-08-07 DIAGNOSIS — I700 Atherosclerosis of aorta: Secondary | ICD-10-CM | POA: Diagnosis not present

## 2022-08-07 DIAGNOSIS — J301 Allergic rhinitis due to pollen: Secondary | ICD-10-CM | POA: Diagnosis not present

## 2022-08-07 DIAGNOSIS — E039 Hypothyroidism, unspecified: Secondary | ICD-10-CM | POA: Diagnosis not present

## 2022-08-07 DIAGNOSIS — E7800 Pure hypercholesterolemia, unspecified: Secondary | ICD-10-CM | POA: Diagnosis not present

## 2022-11-01 DIAGNOSIS — G5601 Carpal tunnel syndrome, right upper limb: Secondary | ICD-10-CM | POA: Diagnosis not present

## 2022-11-01 DIAGNOSIS — G5602 Carpal tunnel syndrome, left upper limb: Secondary | ICD-10-CM | POA: Diagnosis not present

## 2022-11-01 DIAGNOSIS — G5603 Carpal tunnel syndrome, bilateral upper limbs: Secondary | ICD-10-CM | POA: Diagnosis not present

## 2022-12-29 ENCOUNTER — Encounter (HOSPITAL_BASED_OUTPATIENT_CLINIC_OR_DEPARTMENT_OTHER): Payer: Self-pay

## 2022-12-29 ENCOUNTER — Other Ambulatory Visit (HOSPITAL_BASED_OUTPATIENT_CLINIC_OR_DEPARTMENT_OTHER): Payer: Self-pay

## 2022-12-29 ENCOUNTER — Emergency Department (HOSPITAL_BASED_OUTPATIENT_CLINIC_OR_DEPARTMENT_OTHER)
Admission: EM | Admit: 2022-12-29 | Discharge: 2022-12-29 | Disposition: A | Discharge status: Home / Self Care | Payer: Medicare Other | Attending: Emergency Medicine | Admitting: Emergency Medicine

## 2022-12-29 ENCOUNTER — Other Ambulatory Visit: Payer: Self-pay

## 2022-12-29 ENCOUNTER — Emergency Department (HOSPITAL_BASED_OUTPATIENT_CLINIC_OR_DEPARTMENT_OTHER): Payer: Medicare Other | Admitting: Radiology

## 2022-12-29 DIAGNOSIS — M25552 Pain in left hip: Secondary | ICD-10-CM | POA: Diagnosis not present

## 2022-12-29 DIAGNOSIS — M4696 Unspecified inflammatory spondylopathy, lumbar region: Secondary | ICD-10-CM | POA: Diagnosis not present

## 2022-12-29 DIAGNOSIS — Z7982 Long term (current) use of aspirin: Secondary | ICD-10-CM | POA: Diagnosis not present

## 2022-12-29 DIAGNOSIS — W1830XA Fall on same level, unspecified, initial encounter: Secondary | ICD-10-CM | POA: Diagnosis not present

## 2022-12-29 DIAGNOSIS — M545 Low back pain, unspecified: Secondary | ICD-10-CM | POA: Diagnosis not present

## 2022-12-29 DIAGNOSIS — M25551 Pain in right hip: Secondary | ICD-10-CM | POA: Diagnosis not present

## 2022-12-29 DIAGNOSIS — M5459 Other low back pain: Secondary | ICD-10-CM | POA: Diagnosis not present

## 2022-12-29 DIAGNOSIS — M47816 Spondylosis without myelopathy or radiculopathy, lumbar region: Secondary | ICD-10-CM

## 2022-12-29 HISTORY — DX: Unspecified osteoarthritis, unspecified site: M19.90

## 2022-12-29 MED ORDER — TRAMADOL HCL 50 MG PO TABS: 50.0000 mg | ORAL_TABLET | Freq: Four times a day (QID) | ORAL | 0 refills | Status: AC | PRN

## 2022-12-29 MED ORDER — LIDOCAINE 5 % EX PTCH
1.0000 | MEDICATED_PATCH | CUTANEOUS | Status: DC
  Administered 2022-12-29: 1 via TRANSDERMAL
  Filled 2022-12-29: qty 1

## 2022-12-29 MED ORDER — LIDOCAINE 5 % EX PTCH: 1.0000 | MEDICATED_PATCH | CUTANEOUS | 0 refills | Status: AC

## 2022-12-29 NOTE — Discharge Instructions (Addendum)
The x-rays did not show any signs of fractures.  Continue your Voltaren to help with pain.  Try the lidocaine patches as well.  Follow-up with your doctor next week if the symptoms are not improving.

## 2022-12-29 NOTE — ED Provider Notes (Signed)
[facility] Provider Note   CSN: [id number] Arrival date & time: 12/29/22  1505     History  Chief Complaint  Patient presents with   Laurie Odonnell is a 89 y.o. female.   Fall   Patient has a history of diverticulitis and arthritis.  She presents to the ED for evaluation of persistent pain after a fall.  Patient states she had a mechanical fall about a week ago.  She ended up landing on her lower back she also hit her shoulder and her hands.  Patient states she just lost her balance when this occurred.  Patient states she has been managing at home for this past week.  She did notice a bruise on her shoulder initially and that is improving and she no longer is having any pain in her shoulder.  She continues to have pain in her lower back and it moves around the front on both sides.  She is not having abdominal pain.  No dysuria.  No hematuria.  No vomiting or diarrhea.    Home Medications Prior to Admission medications   Medication Sig Start Date End Date Taking? Authorizing Provider  lidocaine (LIDODERM) 5 % Place 1 patch onto the skin daily. Remove & Discard patch within 12 hours or as directed by MD 12/29/22  Yes Dorie Rank, MD  traMADol (ULTRAM) 50 MG tablet Take 1 tablet (50 mg total) by mouth every 6 (six) hours as needed. 12/29/22  Yes Dorie Rank, MD  amLODipine (NORVASC) 5 MG tablet Take 5 mg by mouth daily.    [provider]  aspirin 81 MG chewable tablet Chew 81 mg by mouth daily.    [provider]  atenolol (TENORMIN) 50 MG tablet Take 50 mg by mouth daily.    [provider]  ciprofloxacin (CIPRO) 500 MG tablet Take 1 tablet (500 mg total) by mouth 2 (two) times daily. 08/16/18   Valarie Merino, MD  doxepin (SINEQUAN) 50 MG capsule Take 50 mg by mouth daily.     [provider]  Flaxseed, Linseed, (FLAXSEED OIL PO) Take 10 mLs by mouth daily. Seeds on cereal    [provider]   fluticasone (FLONASE) 50 MCG/ACT nasal spray Place 2 sprays into both nostrils at bedtime.    [provider]  Glucosamine-Chondroitin 1500-1200 MG/30ML LIQD Take 3 mLs by mouth daily.    [provider]  levothyroxine (SYNTHROID, LEVOTHROID) 125 MCG tablet Take 125 mcg by mouth daily before breakfast.    [provider]  MELATONIN PO Take 1 tablet by mouth at bedtime.    [provider]  metroNIDAZOLE (FLAGYL) 500 MG tablet Take 1 tablet (500 mg total) by mouth 2 (two) times daily. 08/16/18   Valarie Merino, MD  Multiple Vitamin (MULTIVITAMIN) capsule Take 1 capsule by mouth daily.    [provider]  Multiple Vitamins-Minerals (PRESERVISION AREDS 2) CAPS Take 5 mg by mouth 2 (two) times daily.    [provider]  ondansetron (ZOFRAN) 4 MG tablet Take 1 tablet (4 mg total) by mouth every 6 (six) hours. 08/16/18   Valarie Merino, MD  pantoprazole (PROTONIX) 20 MG tablet Take 20 mg by mouth daily.    [provider]  pravastatin (PRAVACHOL) 20 MG tablet Take 20 mg by mouth daily.    [provider]      Allergies    Oxycodone    Review of Systems  Review of Systems  Physical Exam Updated Vital Signs BP (!) 148/69 (BP Location: Right Arm)   Pulse 73   Temp 97.7 F (36.5 C)   Resp 18   Ht 1.727 m (5' 8"$ )   Wt 76.2 kg   SpO2 99%   BMI 25.54 kg/m  Physical Exam Vitals and nursing note reviewed.  Constitutional:      Appearance: She is well-developed. She is not diaphoretic.  HENT:     Head: Normocephalic and atraumatic.     Right Ear: External ear normal.     Left Ear: External ear normal.  Eyes:     General: No scleral icterus.       Right eye: No discharge.        Left eye: No discharge.     Conjunctiva/sclera: Conjunctivae normal.  Neck:     Trachea: No tracheal deviation.  Cardiovascular:     Rate and Rhythm: Normal rate and regular rhythm.  Pulmonary:     Effort: Pulmonary effort is normal. No  respiratory distress.     Breath sounds: Normal breath sounds. No stridor. No wheezing or rales.  Abdominal:     General: Bowel sounds are normal. There is no distension.     Palpations: Abdomen is soft.     Tenderness: There is no abdominal tenderness. There is no guarding or rebound.  Musculoskeletal:        General: Tenderness present. No deformity.     Cervical back: Neck supple. No tenderness.     Thoracic back: No tenderness.     Lumbar back: Tenderness present.     Comments: No tenderness palpation bilateral upper extremities, no tenderness to palpation knees or ankles, no tenderness bilateral hip joints  Skin:    General: Skin is warm and dry.     Findings: No rash.  Neurological:     General: No focal deficit present.     Mental Status: She is alert.     Cranial Nerves: No cranial nerve deficit, dysarthria or facial asymmetry.     Sensory: No sensory deficit.     Motor: No abnormal muscle tone or seizure activity.     Coordination: Coordination normal.  Psychiatric:        Mood and Affect: Mood normal.     ED Results / Procedures / Treatments   Labs (all labs ordered are listed, but only abnormal results are displayed) Labs Reviewed - No data to display  EKG None  Radiology DG Hips Bilat W or Wo Pelvis 3-4 Views  Result Date: 12/29/2022 CLINICAL DATA:  Fall onto left side 1 week ago.  Pain. EXAM: DG HIP (WITH OR WITHOUT PELVIS) 3-4V BILAT COMPARISON:  Pelvic CT 08/16/2018. FINDINGS: AP pelvis with AP and frog-leg lateral views of both hips. Status post left hip bipolar hemiarthroplasty. The hardware appears intact without loosening. Grossly stable heterotopic ossification adjacent to the left greater trochanter. No evidence of acute fracture or dislocation. The right hip joint spaces are preserved. The sacroiliac joints appear normal. Lower lumbar spondylosis is further described on separate dedicated lumbar spine series. Prominent iliofemoral atherosclerosis  bilaterally. IMPRESSION: No evidence of acute pelvic or hip fracture or dislocation. Intact left hip arthroplasty. Electronically Signed   By: Richardean Sale M.D.   On: 12/29/2022 16:24   DG Lumbar Spine Complete  Result Date: 12/29/2022 CLINICAL DATA:  Fall week ago, pain EXAM: LUMBAR SPINE - COMPLETE 4+ VIEW COMPARISON:  06/22/2020 FINDINGS: No fracture or dislocation of the lumbar spine.  Severe multilevel disc space height loss and osteophytosis throughout. Moderate to severe multilevel facet degenerative change. Degenerative findings not significantly changed compared to prior examination dated 2021. Status post left hip total arthroplasty. Aortic atherosclerosis. IMPRESSION: 1. No fracture or dislocation of the lumbar spine. 2. Severe multilevel disc and facet degenerative change. Degenerative findings not significantly changed compared to prior examination dated 2021. Electronically Signed   By: Delanna Ahmadi M.D.   On: 12/29/2022 16:24    Procedures Procedures    Medications Ordered in ED Medications  lidocaine (LIDODERM) 5 % 1 patch (1 patch Transdermal Patch Applied 12/29/22 1650)    ED Course/ Medical Decision Making/ A&P Clinical Course as of 12/29/22 1653  Fri Dec 29, 2022  1628 X-rays of hips do not show any signs of fracture.  X-rays of spine show degenerative changes without signs of acute fracture [JK]    Clinical Course User Index [JK] Dorie Rank, MD                             Medical Decision Making Problems Addressed: Acute low back pain without sciatica, unspecified back pain laterality: acute illness or injury that poses a threat to life or bodily functions Arthritis of lumbar spine: chronic illness or injury with exacerbation, progression, or side effects of treatment  Amount and/or Complexity of Data Reviewed Radiology: ordered and independent interpretation performed.    Details: No acute fracture or dislocation  Risk Prescription drug  management.   Patient presented to the ED for evaluation of pain after a fall last week.  She is continue to have pain in her lower back.  Patient has been sleeping well due to the pain.  Her husband for some Voltaren last night and that helped to alleviate her symptoms.  X-rays do not show any signs of any fracture or dislocation.  Patient is not having any abdominal pain.  No urinary symptoms.  Family with concerns about possible kidney injury but I am not seeing any signs to suggest that.  Suspect musculoskeletal pain.  Plan on DC with lidocaine pain patch prescription.  Patient states she usually takes Ultram for pain and also requests a prescription of that.        Final Clinical Impression(s) / ED Diagnoses Final diagnoses:  Acute low back pain without sciatica, unspecified back pain laterality  Arthritis of lumbar spine    Rx / DC Orders ED Discharge Orders          Ordered    lidocaine (LIDODERM) 5 %  Every 24 hours        12/29/22 1645    traMADol (ULTRAM) 50 MG tablet  Every 6 hours PRN        12/29/22 1652              Dorie Rank, MD 12/29/22 1653

## 2022-12-29 NOTE — ED Triage Notes (Signed)
Fell week ago and have pain across lower back.  States loss balance and fell onto left side.  States hit head but denies LOC  Not on blood thinners.

## 2022-12-29 NOTE — ED Triage Notes (Signed)
States back pain is worse with movement To triage in [facility]

## 2023-02-05 DIAGNOSIS — I700 Atherosclerosis of aorta: Secondary | ICD-10-CM | POA: Diagnosis not present

## 2023-02-05 DIAGNOSIS — H3530 Unspecified macular degeneration: Secondary | ICD-10-CM | POA: Diagnosis not present

## 2023-02-05 DIAGNOSIS — R2689 Other abnormalities of gait and mobility: Secondary | ICD-10-CM | POA: Diagnosis not present

## 2023-02-05 DIAGNOSIS — N3946 Mixed incontinence: Secondary | ICD-10-CM | POA: Diagnosis not present

## 2023-02-05 DIAGNOSIS — E039 Hypothyroidism, unspecified: Secondary | ICD-10-CM | POA: Diagnosis not present

## 2023-02-15 DIAGNOSIS — M47816 Spondylosis without myelopathy or radiculopathy, lumbar region: Secondary | ICD-10-CM | POA: Diagnosis not present

## 2023-02-15 DIAGNOSIS — Z9181 History of falling: Secondary | ICD-10-CM | POA: Diagnosis not present

## 2023-02-15 DIAGNOSIS — R2689 Other abnormalities of gait and mobility: Secondary | ICD-10-CM | POA: Diagnosis not present

## 2023-02-15 DIAGNOSIS — R5381 Other malaise: Secondary | ICD-10-CM | POA: Diagnosis not present

## 2023-02-15 DIAGNOSIS — M6281 Muscle weakness (generalized): Secondary | ICD-10-CM | POA: Diagnosis not present

## 2023-02-20 DIAGNOSIS — M6281 Muscle weakness (generalized): Secondary | ICD-10-CM | POA: Diagnosis not present

## 2023-02-20 DIAGNOSIS — R5381 Other malaise: Secondary | ICD-10-CM | POA: Diagnosis not present

## 2023-02-20 DIAGNOSIS — Z9181 History of falling: Secondary | ICD-10-CM | POA: Diagnosis not present

## 2023-02-20 DIAGNOSIS — M47816 Spondylosis without myelopathy or radiculopathy, lumbar region: Secondary | ICD-10-CM | POA: Diagnosis not present

## 2023-02-20 DIAGNOSIS — R2689 Other abnormalities of gait and mobility: Secondary | ICD-10-CM | POA: Diagnosis not present

## 2023-02-22 DIAGNOSIS — R2689 Other abnormalities of gait and mobility: Secondary | ICD-10-CM | POA: Diagnosis not present

## 2023-02-22 DIAGNOSIS — Z9181 History of falling: Secondary | ICD-10-CM | POA: Diagnosis not present

## 2023-02-22 DIAGNOSIS — R5381 Other malaise: Secondary | ICD-10-CM | POA: Diagnosis not present

## 2023-02-22 DIAGNOSIS — M6281 Muscle weakness (generalized): Secondary | ICD-10-CM | POA: Diagnosis not present

## 2023-02-22 DIAGNOSIS — M47816 Spondylosis without myelopathy or radiculopathy, lumbar region: Secondary | ICD-10-CM | POA: Diagnosis not present

## 2023-02-27 DIAGNOSIS — R5381 Other malaise: Secondary | ICD-10-CM | POA: Diagnosis not present

## 2023-02-27 DIAGNOSIS — Z9181 History of falling: Secondary | ICD-10-CM | POA: Diagnosis not present

## 2023-02-27 DIAGNOSIS — M6281 Muscle weakness (generalized): Secondary | ICD-10-CM | POA: Diagnosis not present

## 2023-02-27 DIAGNOSIS — R2689 Other abnormalities of gait and mobility: Secondary | ICD-10-CM | POA: Diagnosis not present

## 2023-02-27 DIAGNOSIS — M47816 Spondylosis without myelopathy or radiculopathy, lumbar region: Secondary | ICD-10-CM | POA: Diagnosis not present

## 2023-03-01 DIAGNOSIS — R5381 Other malaise: Secondary | ICD-10-CM | POA: Diagnosis not present

## 2023-03-01 DIAGNOSIS — M47816 Spondylosis without myelopathy or radiculopathy, lumbar region: Secondary | ICD-10-CM | POA: Diagnosis not present

## 2023-03-01 DIAGNOSIS — Z9181 History of falling: Secondary | ICD-10-CM | POA: Diagnosis not present

## 2023-03-01 DIAGNOSIS — M6281 Muscle weakness (generalized): Secondary | ICD-10-CM | POA: Diagnosis not present

## 2023-03-01 DIAGNOSIS — R2689 Other abnormalities of gait and mobility: Secondary | ICD-10-CM | POA: Diagnosis not present

## 2023-03-06 DIAGNOSIS — R5381 Other malaise: Secondary | ICD-10-CM | POA: Diagnosis not present

## 2023-03-06 DIAGNOSIS — M47816 Spondylosis without myelopathy or radiculopathy, lumbar region: Secondary | ICD-10-CM | POA: Diagnosis not present

## 2023-03-06 DIAGNOSIS — Z9181 History of falling: Secondary | ICD-10-CM | POA: Diagnosis not present

## 2023-03-06 DIAGNOSIS — R2689 Other abnormalities of gait and mobility: Secondary | ICD-10-CM | POA: Diagnosis not present

## 2023-03-06 DIAGNOSIS — M6281 Muscle weakness (generalized): Secondary | ICD-10-CM | POA: Diagnosis not present

## 2023-03-07 DIAGNOSIS — R42 Dizziness and giddiness: Secondary | ICD-10-CM | POA: Diagnosis not present

## 2023-03-08 DIAGNOSIS — M47816 Spondylosis without myelopathy or radiculopathy, lumbar region: Secondary | ICD-10-CM | POA: Diagnosis not present

## 2023-03-08 DIAGNOSIS — M6281 Muscle weakness (generalized): Secondary | ICD-10-CM | POA: Diagnosis not present

## 2023-03-08 DIAGNOSIS — R2689 Other abnormalities of gait and mobility: Secondary | ICD-10-CM | POA: Diagnosis not present

## 2023-03-08 DIAGNOSIS — Z9181 History of falling: Secondary | ICD-10-CM | POA: Diagnosis not present

## 2023-03-08 DIAGNOSIS — R5381 Other malaise: Secondary | ICD-10-CM | POA: Diagnosis not present

## 2023-03-13 DIAGNOSIS — M6281 Muscle weakness (generalized): Secondary | ICD-10-CM | POA: Diagnosis not present

## 2023-03-13 DIAGNOSIS — M47816 Spondylosis without myelopathy or radiculopathy, lumbar region: Secondary | ICD-10-CM | POA: Diagnosis not present

## 2023-03-13 DIAGNOSIS — Z9181 History of falling: Secondary | ICD-10-CM | POA: Diagnosis not present

## 2023-03-13 DIAGNOSIS — R5381 Other malaise: Secondary | ICD-10-CM | POA: Diagnosis not present

## 2023-03-13 DIAGNOSIS — R2689 Other abnormalities of gait and mobility: Secondary | ICD-10-CM | POA: Diagnosis not present

## 2023-03-15 DIAGNOSIS — R5381 Other malaise: Secondary | ICD-10-CM | POA: Diagnosis not present

## 2023-03-15 DIAGNOSIS — M6281 Muscle weakness (generalized): Secondary | ICD-10-CM | POA: Diagnosis not present

## 2023-03-15 DIAGNOSIS — Z9181 History of falling: Secondary | ICD-10-CM | POA: Diagnosis not present

## 2023-03-15 DIAGNOSIS — M47816 Spondylosis without myelopathy or radiculopathy, lumbar region: Secondary | ICD-10-CM | POA: Diagnosis not present

## 2023-03-15 DIAGNOSIS — R2689 Other abnormalities of gait and mobility: Secondary | ICD-10-CM | POA: Diagnosis not present

## 2023-03-19 DIAGNOSIS — R2689 Other abnormalities of gait and mobility: Secondary | ICD-10-CM | POA: Diagnosis not present

## 2023-03-19 DIAGNOSIS — M6281 Muscle weakness (generalized): Secondary | ICD-10-CM | POA: Diagnosis not present

## 2023-03-19 DIAGNOSIS — R5381 Other malaise: Secondary | ICD-10-CM | POA: Diagnosis not present

## 2023-03-19 DIAGNOSIS — M47816 Spondylosis without myelopathy or radiculopathy, lumbar region: Secondary | ICD-10-CM | POA: Diagnosis not present

## 2023-03-19 DIAGNOSIS — Z9181 History of falling: Secondary | ICD-10-CM | POA: Diagnosis not present

## 2023-03-22 DIAGNOSIS — R5381 Other malaise: Secondary | ICD-10-CM | POA: Diagnosis not present

## 2023-03-22 DIAGNOSIS — M6281 Muscle weakness (generalized): Secondary | ICD-10-CM | POA: Diagnosis not present

## 2023-03-22 DIAGNOSIS — R2689 Other abnormalities of gait and mobility: Secondary | ICD-10-CM | POA: Diagnosis not present

## 2023-03-22 DIAGNOSIS — M47816 Spondylosis without myelopathy or radiculopathy, lumbar region: Secondary | ICD-10-CM | POA: Diagnosis not present

## 2023-03-22 DIAGNOSIS — Z9181 History of falling: Secondary | ICD-10-CM | POA: Diagnosis not present

## 2023-03-26 DIAGNOSIS — R2689 Other abnormalities of gait and mobility: Secondary | ICD-10-CM | POA: Diagnosis not present

## 2023-03-26 DIAGNOSIS — Z9181 History of falling: Secondary | ICD-10-CM | POA: Diagnosis not present

## 2023-03-26 DIAGNOSIS — R5381 Other malaise: Secondary | ICD-10-CM | POA: Diagnosis not present

## 2023-03-26 DIAGNOSIS — M47816 Spondylosis without myelopathy or radiculopathy, lumbar region: Secondary | ICD-10-CM | POA: Diagnosis not present

## 2023-03-26 DIAGNOSIS — M6281 Muscle weakness (generalized): Secondary | ICD-10-CM | POA: Diagnosis not present

## 2023-03-29 DIAGNOSIS — R5381 Other malaise: Secondary | ICD-10-CM | POA: Diagnosis not present

## 2023-03-29 DIAGNOSIS — R2689 Other abnormalities of gait and mobility: Secondary | ICD-10-CM | POA: Diagnosis not present

## 2023-03-29 DIAGNOSIS — Z9181 History of falling: Secondary | ICD-10-CM | POA: Diagnosis not present

## 2023-03-29 DIAGNOSIS — M6281 Muscle weakness (generalized): Secondary | ICD-10-CM | POA: Diagnosis not present

## 2023-03-29 DIAGNOSIS — M47816 Spondylosis without myelopathy or radiculopathy, lumbar region: Secondary | ICD-10-CM | POA: Diagnosis not present

## 2023-04-02 DIAGNOSIS — R5381 Other malaise: Secondary | ICD-10-CM | POA: Diagnosis not present

## 2023-04-02 DIAGNOSIS — M6281 Muscle weakness (generalized): Secondary | ICD-10-CM | POA: Diagnosis not present

## 2023-04-02 DIAGNOSIS — Z9181 History of falling: Secondary | ICD-10-CM | POA: Diagnosis not present

## 2023-04-02 DIAGNOSIS — M47816 Spondylosis without myelopathy or radiculopathy, lumbar region: Secondary | ICD-10-CM | POA: Diagnosis not present

## 2023-04-02 DIAGNOSIS — R2689 Other abnormalities of gait and mobility: Secondary | ICD-10-CM | POA: Diagnosis not present

## 2023-04-05 DIAGNOSIS — M6281 Muscle weakness (generalized): Secondary | ICD-10-CM | POA: Diagnosis not present

## 2023-04-05 DIAGNOSIS — R2689 Other abnormalities of gait and mobility: Secondary | ICD-10-CM | POA: Diagnosis not present

## 2023-04-05 DIAGNOSIS — R5381 Other malaise: Secondary | ICD-10-CM | POA: Diagnosis not present

## 2023-04-05 DIAGNOSIS — Z9181 History of falling: Secondary | ICD-10-CM | POA: Diagnosis not present

## 2023-04-05 DIAGNOSIS — M47816 Spondylosis without myelopathy or radiculopathy, lumbar region: Secondary | ICD-10-CM | POA: Diagnosis not present

## 2023-04-05 DIAGNOSIS — E039 Hypothyroidism, unspecified: Secondary | ICD-10-CM | POA: Diagnosis not present

## 2023-04-10 DIAGNOSIS — R2689 Other abnormalities of gait and mobility: Secondary | ICD-10-CM | POA: Diagnosis not present

## 2023-04-10 DIAGNOSIS — R5381 Other malaise: Secondary | ICD-10-CM | POA: Diagnosis not present

## 2023-04-10 DIAGNOSIS — M6281 Muscle weakness (generalized): Secondary | ICD-10-CM | POA: Diagnosis not present

## 2023-04-10 DIAGNOSIS — M47816 Spondylosis without myelopathy or radiculopathy, lumbar region: Secondary | ICD-10-CM | POA: Diagnosis not present

## 2023-04-10 DIAGNOSIS — Z9181 History of falling: Secondary | ICD-10-CM | POA: Diagnosis not present

## 2023-04-12 DIAGNOSIS — M6281 Muscle weakness (generalized): Secondary | ICD-10-CM | POA: Diagnosis not present

## 2023-04-12 DIAGNOSIS — R2689 Other abnormalities of gait and mobility: Secondary | ICD-10-CM | POA: Diagnosis not present

## 2023-04-12 DIAGNOSIS — R5381 Other malaise: Secondary | ICD-10-CM | POA: Diagnosis not present

## 2023-04-12 DIAGNOSIS — M47816 Spondylosis without myelopathy or radiculopathy, lumbar region: Secondary | ICD-10-CM | POA: Diagnosis not present

## 2023-04-12 DIAGNOSIS — Z9181 History of falling: Secondary | ICD-10-CM | POA: Diagnosis not present

## 2023-08-08 DIAGNOSIS — Z23 Encounter for immunization: Secondary | ICD-10-CM | POA: Diagnosis not present

## 2023-08-15 DIAGNOSIS — Z0001 Encounter for general adult medical examination with abnormal findings: Secondary | ICD-10-CM | POA: Diagnosis not present

## 2023-08-15 DIAGNOSIS — I700 Atherosclerosis of aorta: Secondary | ICD-10-CM | POA: Diagnosis not present

## 2023-08-15 DIAGNOSIS — H3530 Unspecified macular degeneration: Secondary | ICD-10-CM | POA: Diagnosis not present

## 2023-08-15 DIAGNOSIS — E7800 Pure hypercholesterolemia, unspecified: Secondary | ICD-10-CM | POA: Diagnosis not present

## 2023-08-15 DIAGNOSIS — E039 Hypothyroidism, unspecified: Secondary | ICD-10-CM | POA: Diagnosis not present

## 2023-08-15 DIAGNOSIS — Z1331 Encounter for screening for depression: Secondary | ICD-10-CM | POA: Diagnosis not present

## 2023-08-15 DIAGNOSIS — Z79899 Other long term (current) drug therapy: Secondary | ICD-10-CM | POA: Diagnosis not present

## 2023-08-15 DIAGNOSIS — N3946 Mixed incontinence: Secondary | ICD-10-CM | POA: Diagnosis not present

## 2023-08-29 DIAGNOSIS — G5603 Carpal tunnel syndrome, bilateral upper limbs: Secondary | ICD-10-CM | POA: Diagnosis not present

## 2023-08-29 DIAGNOSIS — G5601 Carpal tunnel syndrome, right upper limb: Secondary | ICD-10-CM | POA: Diagnosis not present

## 2023-08-29 DIAGNOSIS — G5602 Carpal tunnel syndrome, left upper limb: Secondary | ICD-10-CM | POA: Diagnosis not present

## 2023-09-24 DIAGNOSIS — D2339 Other benign neoplasm of skin of other parts of face: Secondary | ICD-10-CM | POA: Diagnosis not present

## 2023-10-04 DIAGNOSIS — D2339 Other benign neoplasm of skin of other parts of face: Secondary | ICD-10-CM | POA: Diagnosis not present

## 2023-12-18 DIAGNOSIS — M1990 Unspecified osteoarthritis, unspecified site: Secondary | ICD-10-CM | POA: Diagnosis not present

## 2023-12-18 DIAGNOSIS — N3946 Mixed incontinence: Secondary | ICD-10-CM | POA: Diagnosis not present

## 2023-12-18 DIAGNOSIS — E039 Hypothyroidism, unspecified: Secondary | ICD-10-CM | POA: Diagnosis not present

## 2023-12-18 DIAGNOSIS — H3530 Unspecified macular degeneration: Secondary | ICD-10-CM | POA: Diagnosis not present

## 2023-12-18 DIAGNOSIS — Z79899 Other long term (current) drug therapy: Secondary | ICD-10-CM | POA: Diagnosis not present

## 2023-12-18 DIAGNOSIS — N1831 Chronic kidney disease, stage 3a: Secondary | ICD-10-CM | POA: Diagnosis not present

## 2023-12-18 DIAGNOSIS — E7800 Pure hypercholesterolemia, unspecified: Secondary | ICD-10-CM | POA: Diagnosis not present

## 2024-02-06 DIAGNOSIS — M25562 Pain in left knee: Secondary | ICD-10-CM | POA: Diagnosis not present

## 2024-02-06 DIAGNOSIS — M25561 Pain in right knee: Secondary | ICD-10-CM | POA: Diagnosis not present

## 2024-02-06 DIAGNOSIS — M170 Bilateral primary osteoarthritis of knee: Secondary | ICD-10-CM | POA: Diagnosis not present

## 2024-02-06 DIAGNOSIS — R262 Difficulty in walking, not elsewhere classified: Secondary | ICD-10-CM | POA: Diagnosis not present

## 2024-02-13 DIAGNOSIS — R262 Difficulty in walking, not elsewhere classified: Secondary | ICD-10-CM | POA: Diagnosis not present

## 2024-02-13 DIAGNOSIS — M25562 Pain in left knee: Secondary | ICD-10-CM | POA: Diagnosis not present

## 2024-02-13 DIAGNOSIS — M170 Bilateral primary osteoarthritis of knee: Secondary | ICD-10-CM | POA: Diagnosis not present

## 2024-02-13 DIAGNOSIS — M25561 Pain in right knee: Secondary | ICD-10-CM | POA: Diagnosis not present

## 2024-02-20 DIAGNOSIS — R262 Difficulty in walking, not elsewhere classified: Secondary | ICD-10-CM | POA: Diagnosis not present

## 2024-02-20 DIAGNOSIS — M25561 Pain in right knee: Secondary | ICD-10-CM | POA: Diagnosis not present

## 2024-02-20 DIAGNOSIS — M25562 Pain in left knee: Secondary | ICD-10-CM | POA: Diagnosis not present

## 2024-02-20 DIAGNOSIS — M170 Bilateral primary osteoarthritis of knee: Secondary | ICD-10-CM | POA: Diagnosis not present

## 2024-02-27 DIAGNOSIS — M25562 Pain in left knee: Secondary | ICD-10-CM | POA: Diagnosis not present

## 2024-02-27 DIAGNOSIS — M25561 Pain in right knee: Secondary | ICD-10-CM | POA: Diagnosis not present

## 2024-02-27 DIAGNOSIS — M170 Bilateral primary osteoarthritis of knee: Secondary | ICD-10-CM | POA: Diagnosis not present

## 2024-02-27 DIAGNOSIS — R262 Difficulty in walking, not elsewhere classified: Secondary | ICD-10-CM | POA: Diagnosis not present

## 2024-03-05 DIAGNOSIS — M25561 Pain in right knee: Secondary | ICD-10-CM | POA: Diagnosis not present

## 2024-03-05 DIAGNOSIS — M170 Bilateral primary osteoarthritis of knee: Secondary | ICD-10-CM | POA: Diagnosis not present

## 2024-03-05 DIAGNOSIS — R262 Difficulty in walking, not elsewhere classified: Secondary | ICD-10-CM | POA: Diagnosis not present

## 2024-03-05 DIAGNOSIS — M25562 Pain in left knee: Secondary | ICD-10-CM | POA: Diagnosis not present

## 2024-04-16 DIAGNOSIS — M1712 Unilateral primary osteoarthritis, left knee: Secondary | ICD-10-CM | POA: Diagnosis not present

## 2024-04-16 DIAGNOSIS — E039 Hypothyroidism, unspecified: Secondary | ICD-10-CM | POA: Diagnosis not present

## 2024-04-16 DIAGNOSIS — N1831 Chronic kidney disease, stage 3a: Secondary | ICD-10-CM | POA: Diagnosis not present

## 2024-04-16 DIAGNOSIS — I10 Essential (primary) hypertension: Secondary | ICD-10-CM | POA: Diagnosis not present

## 2024-04-16 DIAGNOSIS — Z79899 Other long term (current) drug therapy: Secondary | ICD-10-CM | POA: Diagnosis not present

## 2024-07-23 DIAGNOSIS — Z23 Encounter for immunization: Secondary | ICD-10-CM | POA: Diagnosis not present

## 2024-08-21 DIAGNOSIS — M170 Bilateral primary osteoarthritis of knee: Secondary | ICD-10-CM | POA: Diagnosis not present

## 2024-09-02 DIAGNOSIS — N3946 Mixed incontinence: Secondary | ICD-10-CM | POA: Diagnosis not present

## 2024-09-02 DIAGNOSIS — N1831 Chronic kidney disease, stage 3a: Secondary | ICD-10-CM | POA: Diagnosis not present

## 2024-09-02 DIAGNOSIS — I700 Atherosclerosis of aorta: Secondary | ICD-10-CM | POA: Diagnosis not present

## 2024-09-02 DIAGNOSIS — Z0001 Encounter for general adult medical examination with abnormal findings: Secondary | ICD-10-CM | POA: Diagnosis not present

## 2024-09-02 DIAGNOSIS — E039 Hypothyroidism, unspecified: Secondary | ICD-10-CM | POA: Diagnosis not present

## 2024-09-02 DIAGNOSIS — F321 Major depressive disorder, single episode, moderate: Secondary | ICD-10-CM | POA: Diagnosis not present

## 2024-09-02 DIAGNOSIS — M25562 Pain in left knee: Secondary | ICD-10-CM | POA: Diagnosis not present

## 2024-09-02 DIAGNOSIS — H3530 Unspecified macular degeneration: Secondary | ICD-10-CM | POA: Diagnosis not present

## 2024-09-02 DIAGNOSIS — Z79899 Other long term (current) drug therapy: Secondary | ICD-10-CM | POA: Diagnosis not present

## 2024-09-02 DIAGNOSIS — E7800 Pure hypercholesterolemia, unspecified: Secondary | ICD-10-CM | POA: Diagnosis not present

## 2024-09-02 DIAGNOSIS — I10 Essential (primary) hypertension: Secondary | ICD-10-CM | POA: Diagnosis not present

## 2024-10-01 DIAGNOSIS — G8929 Other chronic pain: Secondary | ICD-10-CM | POA: Diagnosis not present

## 2024-10-01 DIAGNOSIS — M170 Bilateral primary osteoarthritis of knee: Secondary | ICD-10-CM | POA: Diagnosis not present

## 2024-10-21 DIAGNOSIS — M170 Bilateral primary osteoarthritis of knee: Secondary | ICD-10-CM | POA: Diagnosis not present

## 2024-10-28 DIAGNOSIS — M170 Bilateral primary osteoarthritis of knee: Secondary | ICD-10-CM | POA: Diagnosis not present
